# Patient Record
Sex: Female | Born: 1960 | Race: White | Hispanic: No | Marital: Single | Smoking: Never smoker
Health system: Southern US, Community
[De-identification: ages and names within clinical notes are randomized; demographics above are authoritative.]

## PROBLEM LIST (undated history)

## (undated) DIAGNOSIS — J45909 Unspecified asthma, uncomplicated: Secondary | ICD-10-CM

## (undated) DIAGNOSIS — R011 Cardiac murmur, unspecified: Secondary | ICD-10-CM

## (undated) DIAGNOSIS — C541 Malignant neoplasm of endometrium: Secondary | ICD-10-CM

## (undated) DIAGNOSIS — J189 Pneumonia, unspecified organism: Secondary | ICD-10-CM

## (undated) DIAGNOSIS — D219 Benign neoplasm of connective and other soft tissue, unspecified: Secondary | ICD-10-CM

## (undated) DIAGNOSIS — N83209 Unspecified ovarian cyst, unspecified side: Secondary | ICD-10-CM

## (undated) HISTORY — PX: DENTAL SURGERY: SHX609

## (undated) HISTORY — DX: Malignant neoplasm of endometrium: C54.1

## (undated) HISTORY — DX: Unspecified ovarian cyst, unspecified side: N83.209

## (undated) HISTORY — DX: Unspecified asthma, uncomplicated: J45.909

## (undated) HISTORY — DX: Pneumonia, unspecified organism: J18.9

## (undated) HISTORY — DX: Benign neoplasm of connective and other soft tissue, unspecified: D21.9

## (undated) HISTORY — DX: Cardiac murmur, unspecified: R01.1

---

## 2008-06-07 LAB — LIPID PANEL
Cholesterol: 218 mg/dL — AB (ref 0–200)
HDL: 56 mg/dL (ref 35–70)
LDL Cholesterol: 139 mg/dL

## 2010-06-08 ENCOUNTER — Other Ambulatory Visit: Payer: BC Managed Care – PPO

## 2010-06-12 ENCOUNTER — Ambulatory Visit: Payer: Self-pay | Admitting: Family Medicine

## 2012-03-23 ENCOUNTER — Other Ambulatory Visit: Payer: Self-pay | Admitting: Physician Assistant

## 2012-03-23 ENCOUNTER — Ambulatory Visit (INDEPENDENT_AMBULATORY_CARE_PROVIDER_SITE_OTHER): Payer: BC Managed Care – PPO | Admitting: Physician Assistant

## 2012-03-23 ENCOUNTER — Encounter: Payer: Self-pay | Admitting: Physician Assistant

## 2012-03-23 VITALS — BP 126/69 | HR 70 | Ht 65.5 in | Wt 289.0 lb

## 2012-03-23 DIAGNOSIS — D179 Benign lipomatous neoplasm, unspecified: Secondary | ICD-10-CM

## 2012-03-23 DIAGNOSIS — Z1231 Encounter for screening mammogram for malignant neoplasm of breast: Secondary | ICD-10-CM

## 2012-03-23 DIAGNOSIS — Z7689 Persons encountering health services in other specified circumstances: Secondary | ICD-10-CM

## 2012-03-23 DIAGNOSIS — Z1239 Encounter for other screening for malignant neoplasm of breast: Secondary | ICD-10-CM

## 2012-03-23 NOTE — Patient Instructions (Addendum)
Will refer for mammogram.   Schedule CPE/PAP.   Lipoma A lipoma is a noncancerous (benign) tumor composed of fat cells. They are usually found under the skin (subcutaneous). A lipoma may occur in any tissue of the body that contains fat. Common areas for lipomas to appear include the back, shoulders, buttocks, and thighs. Lipomas are a very common soft tissue growth. They are soft and grow slowly. Most problems caused by a lipoma depend on where it is growing. DIAGNOSIS  A lipoma can be diagnosed with a physical exam. These tumors rarely become cancerous, but radiographic studies can help determine this for certain. Studies used may include:  Computerized X-ray scans (CT or CAT scan).  Computerized magnetic scans (MRI). TREATMENT  Small lipomas that are not causing problems may be watched. If a lipoma continues to enlarge or causes problems, removal is often the best treatment. Lipomas can also be removed to improve appearance. Surgery is done to remove the fatty cells and the surrounding capsule. Most often, this is done with medicine that numbs the area (local anesthetic). The removed tissue is examined under a microscope to make sure it is not cancerous. Keep all follow-up appointments with your caregiver. SEEK MEDICAL CARE IF:   The lipoma becomes larger or hard.  The lipoma becomes painful, red, or increasingly swollen. These could be signs of infection or a more serious condition. Document Released: 01/25/2002 Document Revised: 04/29/2011 Document Reviewed: 07/07/2009 [facility] Patient Information 2013 [place], [place].

## 2012-03-23 NOTE — Progress Notes (Signed)
  Subjective:    Patient ID: Alicia White, female    DOB: 08/03/1960, 52 y.o.   MRN: [id number]  HPI Patient is a 52 yo female who presents to the clinic to establish care. PMH negative for any ongoing medical conditions and not on any daily medication. Pt has not been to a PCP in 3 years. Last pap, mammogram and blood done 3 years ago.   Social, surgical, family history reviewed and update. Denies smoking.  Patient would like to address lump on right thigh and lump on right side of head. She has had lump on right thigh for at least a year and lump on head for 3 plus years. Lump on right side of head feels like it is getting bigger. No pain or discomfort. NOt tried anything to make better. Nothing makes worse. Denies any headaches, vision changes, warmth or heat from lump. No drainage.    Review of Systems  Constitutional: Negative.   HENT: Negative.   Eyes: Negative.   Respiratory: Negative.   Gastrointestinal: Negative.   Genitourinary: Negative.   Musculoskeletal: Negative.   Skin: Negative.   Neurological: Negative.   Hematological: Negative.   Psychiatric/Behavioral: Negative.        Objective:   Physical Exam  Constitutional: She appears well-developed and well-nourished.       Obesity.  HENT:  Head: Atraumatic.       3 x 2 fluctuant mobile/not fixed nodule on the right middle skull. No pain with palpation.Soft.  1cm x 1 cm fluctuant nodule of right thigh. No pain. Mobile/not fixed. Soft.  Cardiovascular: Normal rate, regular rhythm and normal heart sounds.   Pulmonary/Chest: Effort normal and breath sounds normal.  Skin: Skin is warm and dry.  Psychiatric: She has a normal mood and affect. Her behavior is normal.          Assessment & Plan:  Lipoma- Reassured pt that lumps are lipomas. Gave handout. Will continue to watch for changes.   Need CPE. Reminded to come in fasting. Will refer for mammogram.

## 2012-03-25 ENCOUNTER — Ambulatory Visit: Payer: BC Managed Care – PPO | Admitting: Physician Assistant

## 2012-04-01 ENCOUNTER — Ambulatory Visit (INDEPENDENT_AMBULATORY_CARE_PROVIDER_SITE_OTHER): Payer: BC Managed Care – PPO

## 2012-04-01 ENCOUNTER — Other Ambulatory Visit (HOSPITAL_COMMUNITY)
Admission: RE | Admit: 2012-04-01 | Discharge: 2012-04-01 | Disposition: A | Discharge status: Home / Self Care | Payer: BC Managed Care – PPO | Source: Ambulatory Visit | Attending: Family Medicine | Admitting: Family Medicine

## 2012-04-01 ENCOUNTER — Encounter: Payer: Self-pay | Admitting: Physician Assistant

## 2012-04-01 ENCOUNTER — Ambulatory Visit (INDEPENDENT_AMBULATORY_CARE_PROVIDER_SITE_OTHER): Payer: BC Managed Care – PPO | Admitting: Physician Assistant

## 2012-04-01 VITALS — BP 129/75 | HR 70 | Wt 278.0 lb

## 2012-04-01 DIAGNOSIS — M7520 Bicipital tendinitis, unspecified shoulder: Secondary | ICD-10-CM

## 2012-04-01 DIAGNOSIS — L732 Hidradenitis suppurativa: Secondary | ICD-10-CM

## 2012-04-01 DIAGNOSIS — N939 Abnormal uterine and vaginal bleeding, unspecified: Secondary | ICD-10-CM

## 2012-04-01 DIAGNOSIS — N926 Irregular menstruation, unspecified: Secondary | ICD-10-CM

## 2012-04-01 DIAGNOSIS — Z01419 Encounter for gynecological examination (general) (routine) without abnormal findings: Secondary | ICD-10-CM | POA: Insufficient documentation

## 2012-04-01 DIAGNOSIS — Z131 Encounter for screening for diabetes mellitus: Secondary | ICD-10-CM

## 2012-04-01 DIAGNOSIS — Z1231 Encounter for screening mammogram for malignant neoplasm of breast: Secondary | ICD-10-CM

## 2012-04-01 DIAGNOSIS — Z124 Encounter for screening for malignant neoplasm of cervix: Secondary | ICD-10-CM

## 2012-04-01 DIAGNOSIS — L739 Follicular disorder, unspecified: Secondary | ICD-10-CM

## 2012-04-01 DIAGNOSIS — L679 Hair color and hair shaft abnormality, unspecified: Secondary | ICD-10-CM

## 2012-04-01 DIAGNOSIS — Z0000 Encounter for general adult medical examination without abnormal findings: Secondary | ICD-10-CM

## 2012-04-01 DIAGNOSIS — R5381 Other malaise: Secondary | ICD-10-CM

## 2012-04-01 DIAGNOSIS — Z13220 Encounter for screening for lipoid disorders: Secondary | ICD-10-CM

## 2012-04-01 DIAGNOSIS — M7522 Bicipital tendinitis, left shoulder: Secondary | ICD-10-CM

## 2012-04-01 DIAGNOSIS — R5383 Other fatigue: Secondary | ICD-10-CM

## 2012-04-01 LAB — COMPLETE METABOLIC PANEL WITH GFR
ALT: 26 U/L (ref 0–35)
Albumin: 4.3 g/dL (ref 3.5–5.2)
Alkaline Phosphatase: 50 U/L (ref 39–117)
GFR, Est Non African American: >89 mL/min
Glucose, Bld: 87 mg/dL (ref 70–99)
Potassium: 4.1 mEq/L (ref 3.5–5.3)
Sodium: 140 mEq/L (ref 135–145)
Total Bilirubin: 0.6 mg/dL (ref 0.3–1.2)
Total Protein: 7.6 g/dL (ref 6.0–8.3)

## 2012-04-01 LAB — CBC WITH DIFFERENTIAL/PLATELET
Basophils Relative: 0 % (ref 0–1)
HCT: 39.4 % (ref 36.0–46.0)
Hemoglobin: 13.1 g/dL (ref 12.0–15.0)
Lymphocytes Relative: 22 % (ref 12–46)
Lymphs Abs: 2.2 10*3/uL (ref 0.7–4.0)
MCHC: 33.2 g/dL (ref 30.0–36.0)
Monocytes Absolute: 0.7 10*3/uL (ref 0.1–1.0)
Monocytes Relative: 7 % (ref 3–12)
Neutro Abs: 6.8 10*3/uL (ref 1.7–7.7)
Neutrophils Relative %: 69 % (ref 43–77)
RBC: 4.61 MIL/uL (ref 3.87–5.11)
WBC: 9.9 10*3/uL (ref 4.0–10.5)

## 2012-04-01 LAB — LIPID PANEL
LDL Cholesterol: 137 mg/dL — ABNORMAL HIGH (ref 0–99)
Total CHOL/HDL Ratio: 4.3 Ratio

## 2012-04-01 LAB — VITAMIN B12: Vitamin B-12: 337 pg/mL (ref 211–911)

## 2012-04-01 MED ORDER — DOXYCYCLINE HYCLATE 100 MG PO CAPS: 100.0000 mg | ORAL_CAPSULE | Freq: Two times a day (BID) | ORAL | Status: DC

## 2012-04-01 MED ORDER — SPIRONOLACTONE 50 MG PO TABS: 50.0000 mg | ORAL_TABLET | Freq: Two times a day (BID) | ORAL | Status: DC

## 2012-04-01 MED ORDER — MUPIROCIN 2 % EX OINT: TOPICAL_OINTMENT | Freq: Three times a day (TID) | CUTANEOUS | Status: DC

## 2012-04-01 NOTE — Progress Notes (Signed)
Subjective:    Patient ID: Alicia White, female    DOB: Apr 12, 1960, 52 y.o.   MRN: [id number]  HPI 1   Review of Systems     Objective:   Physical Exam        Assessment & Plan:   Subjective:     Alicia White is a 52 y.o. female and is here for a comprehensive physical exam. The patient reports problems - left shoulder pain, vaginal spotting, bilateral wrist pain, coarse hair growth.  and boils all over body.  Left shoulder has been painful for a couple of months. Denies any trauma to shoulder. Aleve helps. Movement makes worse. Sometimes better than other times. At worse 7/10. Able to sleep on it without pain. Does not wake her up at night.  Vaginal spotting started early January and has continued since. She had not had a period in 6 months up to then. Denies any pain assoicated. Not on OCP. Wonders about menopause. She has always had irregular periods unless she was on OCP> she is very fatigued and feels more worn out. Denies SOB.   She is having bilateral wrist pain for over 2 years. Her father had carpel tunnel. She types all day long. Night splints help at night. Aleve also helps. Numbness and tingling goes into both hands.   She would like something to help with coarse hair growth. She has had this problem for many years since her early 40's. She does not notice getting better or worse.    She is having a lot of boils pop up under her arm and in between her legs. She has not done anything to make better. She does not notice that anything makes worse. They do ache and sometimes drain. Denies any fever, chills, muscle ache.   History   Social History  . Marital Status: Single    Spouse Name: N/A    Number of Children: N/A  . Years of Education: N/A   Occupational History  . Not on file.   Social History Main Topics  . Smoking status: Never Smoker   . Smokeless tobacco: Never Used  . Alcohol Use: No  . Drug Use: No  . Sexually Active: Yes   Other Topics Concern   . Not on file   Social History Narrative  . No narrative on file   Health Maintenance  Topic Date Due  . Influenza Vaccine  03/23/2013  . Pap Smear  02/18/2014  . Tetanus/tdap  03/24/2019    The following portions of the patient's history were reviewed and updated as appropriate: allergies, current medications, past family history, past medical history, past social history, past surgical history and problem list.  Review of Systems A comprehensive review of systems was negative.   Objective:    BP 129/75  Pulse 70  Wt 278 lb (126.1 kg)  BMI 45.54 kg/m2  LMP 03/21/2012 General appearance: alert, cooperative, appears stated age and moderately obese Head: Normocephalic, without obvious abnormality, atraumatic Eyes: conjunctivae/corneas clear. PERRL, EOM's intact. Fundi benign. Ears: normal TM's and external ear canals both ears Nose: Nares normal. Septum midline. Mucosa normal. No drainage or sinus tenderness. Throat: lips, mucosa, and tongue normal; teeth and gums normal Neck: no adenopathy, no carotid bruit, no JVD, supple, symmetrical, trachea midline and thyroid not enlarged, symmetric, no tenderness/mass/nodules Back: symmetric, no curvature. ROM normal. No CVA tenderness. Lungs: clear to auscultation bilaterally Breasts: normal appearance, no masses or tenderness Heart: regular rate and rhythm, S1, S2 normal, no murmur,  click, rub or gallop Abdomen: soft, non-tender; bowel sounds normal; no masses,  no organomegaly Pelvic: cervix normal in appearance, external genitalia normal, no adnexal masses or tenderness, no cervical motion tenderness, uterus normal size, shape, and consistency and blood coming from cervical os Extremities: extremities normal, atraumatic, no cyanosis or edema left shoulder: ROM normal but with significant pain. Tenderness to palpation over bicep tendon proximal. No tenderness over acromion or clavice. More pain with supination than pronation. Strength  decreased 4/5 with abduction 5/5 with adduction. No bruises, swelling or color changes.  Pulses: 2+ and symmetric Skin: Pustules under bilateral arms, in between breast, under left breast, and in between bilateral legs.  Lymph nodes: Cervical, supraclavicular, and axillary nodes normal. Neurologic: Grossly normal DTR's decreased no ankle reflex in either knee.    Assessment:    Healthy female exam.      Plan:    CPE/Pap- Screening labs obtained. Vaccines up to date. Will call with pap results. Reminded pt of diet and exercise. Discussed calcium 500mg  twice a day or 4 servings daily.   Bicep tendonitis- Gave exercises. Take aleve as needed for pain. Ice regularly. If not improving could consider injections and potenitally imaging.   Bilateral wrist pain- Reassured pt that she is doing the right thing. Consider night splints and regular aleve usage. If continuing to bother you can order imaging studies and see if surgery is an option.  Abnormal uterine bleeding/hair growth- Could be caused by going into menopause. Will check FSH and tSH. Also could be caused by PCOS. i really suspect she has had this her entire life and never diagnosed. She has course hair growth, infertility, abnormal periods, obesity. Gave handout to look at. Will check testosterone. Let's go ahead and start spironactlone 50mg  twice a day. Follow up in 3 months.   hidradenitis suppurative- Gave handout. Sent Doxycycline for 10 days. Gave bactroban to apply to boils as needed.  See After Visit Summary for Counseling Recommendations

## 2012-04-01 NOTE — Patient Instructions (Addendum)
Polycystic Ovarian Syndrome Polycystic ovarian syndrome is a condition with a number of problems. One problem is with the ovaries. The ovaries are organs located in the female pelvis, on each side of the uterus. Usually, during the menstrual cycle, an egg is released from 1 ovary every month. This is called ovulation. When the egg is fertilized, it goes into the womb (uterus), which allows for the growth of a baby. The egg travels from the ovary through the fallopian tube to the uterus. The ovaries also make the hormones estrogen and progesterone. These hormones help the development of a woman's breasts, body shape, and body hair. They also regulate the menstrual cycle and pregnancy. Sometimes, cysts form in the ovaries. A cyst is a fluid-filled sac. On the ovary, different types of cysts can form. The most common type of ovarian cyst is called a functional or ovulation cyst. It is normal, and often forms during the normal menstrual cycle. Each month, a woman's ovaries grow tiny cysts that hold the eggs. When an egg is fully grown, the sac breaks open. This releases the egg. Then, the sac which released the egg from the ovary dissolves. In one type of functional cyst, called a follicle cyst, the sac does not break open to release the egg. It may actually continue to grow. This type of cyst usually disappears within 1 to 3 months.  One type of cyst problem with the ovaries is called Polycystic Ovarian Syndrome (PCOS). In this condition, many follicle cysts form, but do not rupture and produce an egg. This health problem can affect the following:  Menstrual cycle.  Heart.  Obesity.  Cancer of the uterus.  Fertility.  Blood vessels.  Hair growth (face and body) or baldness.  Hormones.  Appearance.  High blood pressure.  Stroke.  Insulin production.  Inflammation of the liver.  Elevated blood cholesterol and triglycerides. CAUSES   No one knows the exact cause of PCOS.  Women with  PCOS often have a mother or sister with PCOS. There is not yet enough proof to say this is inherited.  Many women with PCOS have a weight problem.  Researchers are looking at the relationship between PCOS and the body's ability to make insulin. Insulin is a hormone that regulates the change of sugar, starches, and other food into energy for the body's use, or for storage. Some women with PCOS make too much insulin. It is possible that the ovaries react by making too many female hormones, called androgens. This can lead to acne, excessive hair growth, weight gain, and ovulation problems.  Too much production of luteinizing hormone (LH) from the pituitary gland in the brain stimulates the ovary to produce too much female hormone (androgen). SYMPTOMS   Infrequent or no menstrual periods, and/or irregular bleeding.  Inability to get pregnant (infertility), because of not ovulating.  Increased growth of hair on the face, chest, stomach, back, thumbs, thighs, or toes.  Acne, oily skin, or dandruff.  Pelvic pain.  Weight gain or obesity, usually carrying extra weight around the waist.  Type 2 diabetes (this is the diabetes that usually does not need insulin).  High cholesterol.  High blood pressure.  Female-pattern baldness or thinning hair.  Patches of thickened and dark brown or black skin on the neck, arms, breasts, or thighs.  Skin tags, or tiny excess flaps of skin, in the armpits or neck area.  Sleep apnea (excessive snoring and breathing stops at times while asleep).  Deepening of the voice.  Gestational diabetes when pregnant.  Increased risk of miscarriage with pregnancy. DIAGNOSIS  There is no single test to diagnose PCOS.   Your caregiver will:  Take a medical history.  Perform a pelvic exam.  Perform an ultrasound.  Check your female and female hormone levels.  Measure glucose or sugar levels in the blood.  Do other blood tests.  If you are producing too many  female hormones, your caregiver will make sure it is from PCOS. At the physical exam, your caregiver will want to evaluate the areas of increased hair growth. Try to allow natural hair growth for a few days before the visit.  During a pelvic exam, the ovaries may be enlarged or swollen by the increased number of small cysts. This can be seen more easily by vaginal ultrasound or screening, to examine the ovaries and lining of the uterus (endometrium) for cysts. The uterine lining may become thicker, if there has not been a regular period. TREATMENT  Because there is no cure for PCOS, it needs to be managed to prevent problems. Treatments are based on your symptoms. Treatment is also based on whether you want to have a baby or whether you need contraception.  Treatment may include:  Progesterone hormone, to start a menstrual period.  Birth control pills, to make you have regular menstrual periods.  Medicines to make you ovulate, if you want to get pregnant.  Medicines to control your insulin.  Medicine to control your blood pressure.  Medicine and diet, to control your high cholesterol and triglycerides in your blood.  Surgery, making small holes in the ovary, to decrease the amount of female hormone production. This is done through a long, lighted tube (laparoscope), placed into the pelvis through a tiny incision in the lower abdomen. Your caregiver will go over some of the choices with you. WOMEN WITH PCOS HAVE THESE CHARACTERISTICS:  High levels of female hormones called androgens.  An irregular or no menstrual cycle.  May have many small cysts in their ovaries. PCOS is the most common hormonal reproductive problem in women of childbearing age. WHY DO WOMEN WITH PCOS HAVE TROUBLE WITH THEIR MENSTRUAL CYCLE? Each month, about 20 eggs start to mature in the ovaries. As one egg grows and matures, the follicle breaks open to release the egg, so it can travel through the fallopian tube for  fertilization. When the single egg leaves the follicle, ovulation takes place. In women with PCOS, the ovary does not make all of the hormones it needs for any of the eggs to fully mature. They may start to grow and accumulate fluid, but no one egg becomes large enough. Instead, some may remain as cysts. Since no egg matures or is released, ovulation does not occur and the hormone progesterone is not made. Without progesterone, a woman's menstrual cycle is irregular or absent. Also, the cysts produce female hormones, which continue to prevent ovulation.  Document Released: 05/31/2004 Document Revised: 04/29/2011 Document Reviewed: 12/23/2008 [facility] Patient Information 2013 [place], [place].   Hidradenitis Suppurativa, Sweat Gland Abscess Hidradenitis suppurativa is a long lasting (chronic), uncommon disease of the sweat glands. With this, boil-like lumps and scarring develop in the groin, some times under the arms (axillae), and under the breasts. It may also uncommonly occur behind the ears, in the crease of the buttocks, and around the genitals.  CAUSES  The cause is from a blocking of the sweat glands. They then become infected. It may cause drainage and odor. It is not contagious.  So it cannot be given to someone else. It most often shows up in puberty (about 38 to 50 years of age). But it may happen much later. It is similar to acne which is a disease of the sweat glands. This condition is slightly more common in African-Americans and women. SYMPTOMS   Hidradenitis usually starts as one or more red, tender, swellings in the groin or under the arms (axilla).  Over a period of hours to days the lesions get larger. They often open to the skin surface, draining clear to yellow-colored fluid.  The infected area heals with scarring. DIAGNOSIS  Your caregiver makes this diagnosis by looking at you. Sometimes cultures (growing germs on plates in the lab) may be taken. This is to see what germ  (bacterium) is causing the infection.  TREATMENT   Topical germ killing medicine applied to the skin (antibiotics) are the treatment of choice. Antibiotics taken by mouth (systemic) are sometimes needed when the condition is getting worse or is severe.  Avoid tight-fitting clothing which traps moisture in.  Dirt does not cause hidradenitis and it is not caused by poor hygiene.  Involved areas should be cleaned daily using an antibacterial soap. Some patients find that the liquid form of Lever 2000, applied to the involved areas as a lotion after bathing, can help reduce the odor related to this condition.  Sometimes surgery is needed to drain infected areas or remove scarred tissue. Removal of large amounts of tissue is used only in severe cases.  Birth control pills may be helpful.  Oral retinoids (vitamin A derivatives) for 6 to 12 months which are effective for acne may also help this condition.  Weight loss will improve but not cure hidradenitis. It is made worse by being overweight. But the condition is not caused by being overweight.  This condition is more common in people who have had acne.  It may become worse under stress. There is no medical cure for hidradenitis. It can be controlled, but not cured. The condition usually continues for years with periods of getting worse and getting better (remission). Document Released: 09/19/2003 Document Revised: 04/29/2011 Document Reviewed: 10/05/2007 [facility] Patient Information 2013 [place], [place].    Carpal Tunnel Syndrome The carpal tunnel is a narrow area located on the palm side of your wrist. The tunnel is formed by the wrist bones and ligaments. Nerves, blood vessels, and tendons pass through the carpal tunnel. Repeated wrist motion or certain diseases may cause swelling within the tunnel. This swelling pinches the main nerve in the wrist (median nerve) and causes the painful hand and arm condition called carpal tunnel  syndrome. CAUSES   Repeated wrist motions.  Wrist injuries.  Certain diseases like arthritis, diabetes, alcoholism, hyperthyroidism, and kidney failure.  Obesity.  Pregnancy. SYMPTOMS   A "pins and needles" feeling in your fingers or hand.  Tingling or numbness in your fingers or hand.  An aching feeling in your entire arm.  Wrist pain that goes up your arm to your shoulder.  Pain that goes down into your palm or fingers.  A weak feeling in your hands. DIAGNOSIS  Your caregiver will take your history and perform a physical exam. An electromyography test may be needed. This test measures electrical signals sent out by the muscles. The electrical signals are usually slowed by carpal tunnel syndrome. You may also need X-rays. TREATMENT  Carpal tunnel syndrome may clear up by itself. Your caregiver may recommend a wrist splint or medicine such as  a nonsteroidal anti-inflammatory medicine. Cortisone injections may help. Sometimes, surgery may be needed to free the pinched nerve.  HOME CARE INSTRUCTIONS   Take all medicine as directed by your caregiver. Only take over-the-counter or prescription medicines for pain, discomfort, or fever as directed by your caregiver.  If you were given a splint to keep your wrist from bending, wear it as directed. It is important to wear the splint at night. Wear the splint for as long as you have pain or numbness in your hand, arm, or wrist. This may take 1 to 2 months.  Rest your wrist from any activity that may be causing your pain. If your symptoms are work-related, you may need to talk to your employer about changing to a job that does not require using your wrist.  Put ice on your wrist after long periods of wrist activity.  Put ice in a plastic bag.  Place a towel between your skin and the bag.  Leave the ice on for 15 to 20 minutes, 3 to 4 times a day.  Keep all follow-up visits as directed by your caregiver. This includes any orthopedic  referrals, physical therapy, and rehabilitation. Any delay in getting necessary care could result in a delay or failure of your condition to heal. SEEK IMMEDIATE MEDICAL CARE IF:   You have new, unexplained symptoms.  Your symptoms get worse and are not helped or controlled with medicines. MAKE SURE YOU:   Understand these instructions.  Will watch your condition.  Will get help right away if you are not doing well or get worse. Document Released: 02/02/2000 Document Revised: 04/29/2011 Document Reviewed: 12/21/2010 [facility] Patient Information 2013 [place], [place]. Carpal Tunnel Surgery The carpal tunnel is a narrow hollow area in the wrist. It is formed by the wrist bones and ligaments. Nerves, blood vessels, and tendons on the palm side of your hand pass through the carpal tunnel. (The palm side is the side of your hand in the direction your fingers bend.) Repeated wrist motion or certain diseases may cause swelling within the tunnel. That is why these are sometimes called repetitive trauma disorders. It is also a common problem in late pregnancy because of water retention. This swelling pinches the main nerve in the wrist (median nerve). It causes the painful condition called carpal tunnel syndrome. A feeling of "pins and needles" may be noticed in the fingers or hand. The entire arm may ache from this condition. Carpal tunnel syndrome may clear up by itself. Cortisone injections may help. An electromyogram may be needed to confirm this diagnosis. This is a test which measures nerve conduction. The nerve conduction is usually slowed in a carpal tunnel syndrome. Sometimes, an operation may be needed to free the pinched nerve.  LET YOUR CAREGIVER KNOW ABOUT:  Allergies  Medications taken including herbs, eye drops, over the counter medications, and creams  Use of steroids (by mouth or creams)  Previous problems with anesthetics or novocaine  Possibility of pregnancy, if this  applies  History of blood clots (thrombophlebitis)  History of bleeding or blood problems  Previous surgery  Other health problems RISKS AND COMPLICATIONS  Infection: A germ starts growing in the wound. This can usually be treated with antibiotics.  Damage to other organs may occur.  Bleeding following surgery can be a complication of almost all surgeries. Your surgeon takes every precaution to keep this from happening.  Recurrence (return) of carpal tunnel syndrome following treatment is rare. BEFORE THE PROCEDURE  Stop smoking  at least two weeks prior to surgery. This lowers risk during surgery.  Stop non steroidal medicine for ten days prior to surgery. Also, do not take aspirin unless OK'd by your surgeon.  Your caregiver may tell you to stop taking certain medicine that may affect the outcome of the surgery and your ability to heal. For example, you may need to stop taking anti-inflammatories, such as aspirin, because of possible bleeding problems. Other medicine may have interactions with anesthesia.  BE SURE TO LET YOUR CAREGIVER KNOW IF YOU HAVE BEEN ON STEROIDS (INCLUDING CREAMS) FOR LONG PERIODS OF TIME. THIS IS CRITICAL.  Your caregiver will discuss possible risks and complications with you before surgery. In addition to the usual risks of anesthesia, other common risks and complications include:  Temporary increase in pain due to surgery.  Uncorrected pain.  Infection. You should be present 60 minutes before your procedure or as directed.  PROCEDURE  Carpal tunnel release is generally recommended if symptoms last for 6 months. Surgery involves severing the band of tissue around the wrist to reduce pressure on the median nerve. Surgery is done under local anesthesia and does not require an overnight hospital stay. Many patients require surgery on both hands. The following are types of carpal tunnel release surgery:  Open release surgery, the traditional procedure  used to correct carpal tunnel syndrome, consists of making an incision up to 2 inches in the wrist and then cutting the carpal ligament to enlarge the carpal tunnel. The procedure is generally done under local anesthesia on an outpatient basis, unless there are unusual medical considerations.  Endoscopic surgery may allow faster functional recovery and less post-operative discomfort than traditional open release surgery. The surgeon makes two incisions (cuts) (about 1/2 inch each) in the wrist and palm, inserts a camera attached to a tube, looks at the tissue on a screen, and cuts the carpal ligament (the tissue that holds joints together). This two-portal endoscopic surgery, generally performed under local anesthesia, is effective and minimizes scarring and scar tenderness, if any. One-portal endoscopic surgery for carpal tunnel syndrome is also available. Although symptoms may be better right after surgery, full recovery from carpal tunnel surgery can take months. Some patients may have infection, nerve damage, stiffness, and pain at the scar. Sometimes the wrist loses strength because the carpal ligament is cut. Patients should take part in physical therapy after surgery to restore wrist strength. Some patients may need to adjust job duties or even change jobs after recovery from surgery. The majority of patients recover completely without complications (additional problems). AFTER THE PROCEDURE After surgery, you will be taken to the recovery area where a nurse will watch and check your progress. Once you're awake, stable, and taking fluids well, without other problems you will be allowed to go home. HOME CARE INSTRUCTIONS   Once at home, an ice pack applied to your operative site may help with discomfort and keep the swelling down.  Follow your caregiver's instructions as to activities, exercises, physical therapy, and driving a car.  Maintain strength and range of motion as instructed.  If you  were given a splint to keep your wrist from bending, use it as instructed. It is important to wear the splint at night. Use the splint for as long as you have pain or numbness in your hand, arm or wrist. This may take 1 to 2 months.  If you have pain at night, it may help to elevate your hand above the level of your  heart (the center of your chest).  It is important to avoid activities which originally caused your carpal tunnel syndrome for a couple weeks following surgery, or as directed by your surgeon. If your symptoms are work-related, you may need to talk to your employer about changing to a job that does not require using your wrist.  Only take over-the-counter or prescription medicines for pain, discomfort, or fever as directed by your caregiver.  Following periods of extended use, particularly hard (strenuous) use, apply an ice pack wrapped in a towel to the palm (anterior) side of the affected wrist for 20 to 30 minutes. Repeat as needed three to four times per day. This will help reduce swelling following surgery. SEEK MEDICAL CARE IF:   There is increased bleeding (more than a small spot) from the wound.  You notice redness, swelling, or increasing pain in the wound.  Pus is coming from wound.  An unexplained oral temperature above 102 F (38.9 C) develops.  You notice a foul smell coming from the wound or dressing. SEEK IMMEDIATE MEDICAL CARE IF:   You develop a rash.  You have difficulty breathing.  You have any problems you think are related to allergies. Document Released: 09/19/2003 Document Revised: 04/29/2011 Document Reviewed: 12/11/2006 [facility] Patient Information 2013 [place], [place]. Biceps Tendon Tendinitis (Proximal) and Tenosynovitis with Rehab Tendonitis and tenosynovitis involve inflammation of the tendon and the tendon lining (sheath). The proximal biceps tendon is vulnerable to tendonitis and tenosynovitis, which causes pain and discomfort in the front of  the shoulder and upper arm. The tendon lining secretes a fluid that helps lubricate the tendon, allowing for proper function without pain. When the tendon and its lining become inflamed, the tendon can no longer glide smoothly, causing pain. The proximal biceps tendon connects the biceps muscle to two bones of the shoulder. It is important for proper function of the elbow and turning the palm upward (supination) using the wrist. Proximal biceps tendon tendinitis may include a grade 1 or 2 strain of the tendon. Grade 1 strains involve a slight pull of the tendon without signs of tearing and no observed tendon lengthening. There is also no loss of strength. Grade 2 strains involve small tears in the tendon fibers. The tendon or muscle is stretched and strength is usually decreased.  SYMPTOMS   Pain, tenderness, swelling, warmth, or redness over the front of the shoulder.  Pain that gets worse with shoulder and elbow use, especially against resistance.  Limited motion of the shoulder or elbow.  Crackling sound (crepitation) when the tendon or shoulder is moved or touched. CAUSES  The symptoms of biceps tendonitis are due to inflammation of the tendon. Inflammation may be caused by:  Strain from sudden increase in amount or intensity of activity.  Direct blow or injury to the elbow (uncommon).  Overuse or repetitive elbow bending or wrist rotation, particularly when turning the palm up, or with elbow hyperextension. RISK INCREASES WITH:  Sports that involve contact or overhead arm activity (throwing sports, gymnastics, weightlifting, bodybuilding, rock climbing).  Heavy labor.  Poor strength and flexibility.  Failure to warm up properly before activity. PREVENTION  Warm up and stretch properly before activity.  Allow time for recovery between activities.  Maintain physical fitness:  Strength, flexibility, and endurance.  Cardiovascular fitness.  Learn and use proper exercise  technique. PROGNOSIS  With proper treatment, proximal biceps tendon tendonitis and tenosynovitis is usually curable within 6 weeks. Healing is usually quicker if the cause was  a direct blow, not overuse.  RELATED COMPLICATIONS   Longer healing time if not properly treated or if not given enough time to heal.  Chronically inflamed tendon that causes persistent pain with activity, that may progress to constant pain and potentially rupture of the tendon.  Recurring symptoms, especially if activity is resumed too soon or with overuse, a direct blow, or use of poor exercise technique. TREATMENT Treatment first involves ice and medicine, to reduce pain and inflammation. It is helpful to modify activities that cause pain, to reduce the chances of causing the condition to get worse. Strengthening and stretching exercises should be performed to promote proper use of the muscles of the shoulder. These exercises may be performed at home or with a therapist. Other treatments may be given such as ultrasound or heat therapy. A corticosteroid injection may be recommended to help reduce inflammation of the tendon lining. Surgery is usually not necessary. Sometimes, if symptoms last for greater than 6 months, surgery will be advised to detach the tendon and re-insert it into the arm bone. Surgery to correct other shoulder problems that may be contributing to tendinitis may be advised before surgery for the tendinitis itself.  MEDICATION  If pain medicine is needed, nonsteroidal anti-inflammatory medicines (aspirin and ibuprofen), or other minor pain relievers (acetaminophen), are often advised.  Do not take pain medicine for 7 days before surgery.  Prescription pain relievers may be given if your caregiver thinks they are needed. Use only as directed and only as much as you need.  Corticosteroid injections may be given. These injections should only be used on the most severe cases, as one can only receive a  limited number of them. HEAT AND COLD   Cold treatment (icing) should be applied for 10 to 15 minutes every 2 to 3 hours for inflammation and pain, and immediately after activity that aggravates your symptoms. Use ice packs or an ice massage.  Heat treatment may be used before performing stretching and strengthening activities prescribed by your caregiver, physical therapist, or athletic trainer. Use a heat pack or a warm water soak. SEEK MEDICAL CARE IF:   Symptoms get worse or do not improve in 2 weeks, despite treatment.  New, unexplained symptoms develop. (Drugs used in treatment may produce side effects.) EXERCISES RANGE OF MOTION (ROM) AND EXERCISES - Biceps Tendon (Proximal) and Tenosynovitis These exercises may help you when beginning to rehabilitate your injury. Your symptoms may go away with or without further involvement from your physician, physical therapist, or athletic trainer. While completing these exercises, remember:   Restoring tissue flexibility helps normal motion to return to the joints. This allows healthier, less painful movement and activity.  An effective stretch should be held for at least 30 seconds.  A stretch should never be painful. You should only feel a gentle lengthening or release in the stretched tissue. STRETCH  Flexion, Standing  Stand with good posture. With an underhand grip on your right / left hand and an overhand grip on the opposite hand, grasp a broomstick or cane so that your hands are a little more than shoulder width apart.  Keeping your right / left elbow straight and shoulder muscles relaxed, push the stick with your opposite hand to raise your right / left arm in front of your body and then overhead. Raise your arm until you feel a stretch in your right / left shoulder, but before you have increased shoulder pain.  Try to avoid shrugging your right / left  shoulder as your arm rises, by keeping your shoulder blade tucked down and toward  your mid-back spine. Hold for __________ seconds.  Slowly return to the starting position. Repeat __________ times. Complete this exercise __________ times per day. STRETCH  Abduction, Supine  Lie on your back. With an underhand grip on your right / left hand and an overhand grip on the opposite hand, grasp a broomstick or cane so that your hands are a little more than shoulder width apart.  Keeping your right / left elbow straight and shoulder muscles relaxed, push the stick with your opposite hand to raise your right / left arm out to the side of your body and then overhead. Raise your arm until you feel a stretch in your right / left shoulder, but before you have increased shoulder pain.  Try to avoid shrugging your right / left shoulder as your arm rises, by keeping your shoulder blade tucked down and toward your mid-back spine. Hold for __________ seconds.  Slowly return to the starting position. Repeat __________ times. Complete this exercise __________ times per day. ROM  Flexion, Active-Assisted  Lie on your back. You may bend your knees for comfort.  Grasp a broomstick or cane so your hands are about shoulder width apart. Your right / left hand should grip the end of the stick so that your hand is positioned "thumbs-up," as if you were about to shake hands.  Using your healthy arm to lead, raise your right / left arm overhead until you feel a gentle stretch in your shoulder. Hold for __________ seconds.  Use the stick to assist in returning your right / left arm to its starting position. Repeat __________ times. Complete this exercise __________ times per day.  STRETCH  Flexion, Standing   Stand facing a wall. Walk your right / left fingers up the wall until you feel a moderate stretch in your shoulder. As your hand gets higher, you may need to step closer to the wall or use a door frame to walk through.  Try to avoid shrugging your right / left shoulder as your arm rises, by  keeping your shoulder blade tucked down and toward your mid-back spine.  Hold for __________ seconds. Use your other hand, if needed, to ease out of the stretch and return to the starting position. Repeat __________ times. Complete this exercise __________ times per day.  ROM - Internal Rotation   Using underhand grips, grasp a stick behind your back with both hands.  While standing upright with good posture, slide the stick up your back until you feel a mild stretch in the front of your shoulder.  Hold for __________ seconds. Slowly return to your starting position. Repeat __________ times. Complete this exercise __________ times per day.  STRETCH - Internal Rotation  Place your right / left hand behind your back, palm-up.  Throw a towel or belt over your opposite shoulder. Grasp the towel with your right / left hand.  While keeping an upright posture, gently pull up on the towel until you feel a stretch in the front of your right / left shoulder.  Avoid shrugging your right / left shoulder as your arm rises, by keeping your shoulder blade tucked down and toward your mid-back spine.  Hold for __________ seconds. Release the stretch by lowering your opposite hand. Repeat __________ times. Complete this exercise __________ times per day. STRENGTHENING EXERCISES - Biceps Tendon Tendinitis (Proximal) and Tenosynovitis These exercises may help you regain your strength after  your physician has discontinued your restraint in a cast or brace. They may resolve your symptoms with or without further involvement from your physician, physical therapist or athletic trainer. While completing these exercises, remember:   Muscles can gain both the endurance and the strength needed for everyday activities through controlled exercises.  Complete these exercises as instructed by your physician, physical therapist or athletic trainer. Increase the resistance and repetitions only as guided.  You may  experience muscle soreness or fatigue, but the pain or discomfort you are trying to eliminate should never worsen during these exercises. If this pain does get worse, stop and make sure you are following the directions exactly. If the pain is still present after adjustments, discontinue the exercise until you can discuss the trouble with your caregiver. STRENGTH - Elbow Flexors, Isometric  Stand or sit upright on a firm surface. Place your right / left arm so that your hand is palm-up and at the height of your waist.  Place your opposite hand on top of your forearm. Gently push down as your right / left arm resists. Push as hard as you can with both arms, without causing any pain or movement at your right / left elbow. Hold this stationary position for __________ seconds.  Gradually release the tension in both arms. Allow your muscles to relax completely before repeating. Repeat __________ times. Complete this exercise __________ times per day. STRENGTH - Shoulder Flexion, Isometric  With good posture and facing a wall, stand or sit about 4-6 inches away.  Keeping your right / left elbow straight, gently press the top of your fist into the wall. Increase the pressure gradually until you are pressing as hard as you can, without shrugging your shoulder or increasing any shoulder discomfort.  Hold for __________ seconds.  Release the tension slowly. Relax your shoulder muscles completely before you start the next repetition. Repeat __________ times. Complete this exercise __________ times per day.  STRENGTH  Elbow Flexors, Supinated  With good posture, stand or sit on a firm chair without armrests. Allow your right / left arm to rest at your side with your palm facing forward.  Holding a __________ weight, or gripping a rubber exercise band or tubing,  bring your hand toward your shoulder.  Allow your muscles to control the resistance as your hand returns to your side. Repeat __________  times. Complete this exercise __________ times per day.  STRENGTH - Shoulder Flexion  Stand or sit with good posture. Grasp a __________ weight, or an exercise band or tubing, so that your hand is "thumbs-up," like when you shake hands.  Slowly lift your right / left arm as far as you can, without increasing any shoulder pain. At first, many people can only raise their hand to shoulder height.  Avoid shrugging your right / left shoulder as your arm rises, by keeping your shoulder blade tucked down and toward your mid-back spine.  Hold for __________ seconds. Control the descent of your hand as you slowly return to your starting position. Repeat __________ times. Complete this exercise __________ times per day. Document Released: 02/04/2005 Document Revised: 04/29/2011 Document Reviewed: 05/19/2008 [facility] Patient Information 2013 [place], [place].

## 2012-04-02 LAB — VITAMIN D 25 HYDROXY (VIT D DEFICIENCY, FRACTURES): Vit D, 25-Hydroxy: 32 ng/mL (ref 30–89)

## 2012-04-03 ENCOUNTER — Encounter: Payer: Self-pay | Admitting: Physician Assistant

## 2012-04-03 DIAGNOSIS — J45909 Unspecified asthma, uncomplicated: Secondary | ICD-10-CM | POA: Insufficient documentation

## 2012-04-03 DIAGNOSIS — J45901 Unspecified asthma with (acute) exacerbation: Secondary | ICD-10-CM | POA: Insufficient documentation

## 2012-04-03 LAB — TESTOSTERONE, FREE, TOTAL, SHBG
Sex Hormone Binding: 33 nmol/L (ref 18–114)
Testosterone, Free: 7.9 pg/mL — ABNORMAL HIGH (ref 0.6–6.8)
Testosterone-% Free: 1.8 % (ref 0.4–2.4)
Testosterone: 44 ng/dL (ref 10–70)

## 2012-04-09 ENCOUNTER — Encounter: Payer: Self-pay | Admitting: *Deleted

## 2012-04-29 ENCOUNTER — Telehealth: Payer: Self-pay | Admitting: *Deleted

## 2012-04-29 DIAGNOSIS — N920 Excessive and frequent menstruation with regular cycle: Secondary | ICD-10-CM

## 2012-04-29 DIAGNOSIS — N938 Other specified abnormal uterine and vaginal bleeding: Secondary | ICD-10-CM

## 2012-04-29 NOTE — Telephone Encounter (Signed)
Pt calls & states that she has started bleeding again & it's "pretty heavy" & she's passing clots.  She started last Thursday night. She has been on the spiractalone. Please advise.

## 2012-04-30 ENCOUNTER — Other Ambulatory Visit: Payer: Self-pay | Admitting: Physician Assistant

## 2012-04-30 DIAGNOSIS — N920 Excessive and frequent menstruation with regular cycle: Secondary | ICD-10-CM

## 2012-04-30 DIAGNOSIS — N938 Other specified abnormal uterine and vaginal bleeding: Secondary | ICD-10-CM

## 2012-04-30 MED ORDER — MEDROXYPROGESTERONE ACETATE 10 MG PO TABS: ORAL_TABLET | ORAL | Status: DC

## 2012-04-30 NOTE — Telephone Encounter (Signed)
Will schedule a pelvic ultrasound and also send to pharmacy some progesterone to take for next 10 days to see if we can stop bleeding. Call if bleeding not stopped.

## 2012-04-30 NOTE — Telephone Encounter (Signed)
Pt.notified

## 2012-05-05 ENCOUNTER — Ambulatory Visit (INDEPENDENT_AMBULATORY_CARE_PROVIDER_SITE_OTHER): Payer: BC Managed Care – PPO

## 2012-05-05 DIAGNOSIS — N920 Excessive and frequent menstruation with regular cycle: Secondary | ICD-10-CM

## 2012-05-05 DIAGNOSIS — N83209 Unspecified ovarian cyst, unspecified side: Secondary | ICD-10-CM

## 2012-05-05 DIAGNOSIS — N938 Other specified abnormal uterine and vaginal bleeding: Secondary | ICD-10-CM

## 2012-05-05 DIAGNOSIS — N859 Noninflammatory disorder of uterus, unspecified: Secondary | ICD-10-CM

## 2012-05-05 DIAGNOSIS — N949 Unspecified condition associated with female genital organs and menstrual cycle: Secondary | ICD-10-CM

## 2012-05-05 DIAGNOSIS — D251 Intramural leiomyoma of uterus: Secondary | ICD-10-CM

## 2012-05-06 ENCOUNTER — Other Ambulatory Visit: Payer: Self-pay | Admitting: Physician Assistant

## 2012-05-06 DIAGNOSIS — N938 Other specified abnormal uterine and vaginal bleeding: Secondary | ICD-10-CM

## 2012-05-06 DIAGNOSIS — N83202 Unspecified ovarian cyst, left side: Secondary | ICD-10-CM

## 2012-05-06 DIAGNOSIS — R935 Abnormal findings on diagnostic imaging of other abdominal regions, including retroperitoneum: Secondary | ICD-10-CM

## 2012-05-06 DIAGNOSIS — D259 Leiomyoma of uterus, unspecified: Secondary | ICD-10-CM

## 2012-05-07 ENCOUNTER — Telehealth: Payer: Self-pay

## 2012-05-07 NOTE — Telephone Encounter (Signed)
LM for pt that we received referral from [facility] and call office to schedule appointment.

## 2012-05-28 ENCOUNTER — Ambulatory Visit (INDEPENDENT_AMBULATORY_CARE_PROVIDER_SITE_OTHER): Payer: BC Managed Care – PPO | Admitting: Obstetrics & Gynecology

## 2012-05-28 ENCOUNTER — Encounter: Payer: Self-pay | Admitting: Obstetrics & Gynecology

## 2012-05-28 VITALS — BP 115/73 | HR 65 | Resp 18 | Ht 64.0 in | Wt 287.0 lb

## 2012-05-28 DIAGNOSIS — Z139 Encounter for screening, unspecified: Secondary | ICD-10-CM

## 2012-05-28 DIAGNOSIS — Z01812 Encounter for preprocedural laboratory examination: Secondary | ICD-10-CM

## 2012-05-28 DIAGNOSIS — N924 Excessive bleeding in the premenopausal period: Secondary | ICD-10-CM

## 2012-05-28 LAB — POCT URINE PREGNANCY: Preg Test, Ur: NEGATIVE

## 2012-05-28 MED ORDER — MEDROXYPROGESTERONE ACETATE 10 MG PO TABS: ORAL_TABLET | ORAL | Status: DC

## 2012-05-28 NOTE — Progress Notes (Signed)
  Subjective:    Patient ID: Alicia White, female    DOB: Jun 09, 1960, 52 y.o.   MRN: [id number]  HPI  Pt is a 52 yo nulliparous female with long standing history of irregular menses.  Pt had long period of amenorrhea in there 30s.  She was placed on  OCPs in late 30s and had regular menses.  After stopping OCPs she maintained monthly menstrual cycle for 6-7 yrs.  After that menstrual cycle has become irregular.  Pt has been spotting since January with one episode of very heavy bleeding.  Pt is having hot flashes and flushes.  Review of Systems  Constitutional:       Hot flashes  Respiratory: Negative.   Cardiovascular: Negative.   Genitourinary: Positive for vaginal bleeding and menstrual problem.       Objective:   Physical Exam  Vitals reviewed. Constitutional: She is oriented to person, place, and time. She appears well-developed and well-nourished. No distress.  HENT:  Head: Normocephalic and atraumatic.  Eyes: Conjunctivae are normal.  Pulmonary/Chest: Effort normal.  Abdominal: Soft. She exhibits no distension and no mass. There is no tenderness. There is no rebound and no guarding.  Genitourinary: Vagina normal and uterus normal. No vaginal discharge found.  No adnexal masses.  Exam limited by habitus  Musculoskeletal: She exhibits no tenderness.  Neurological: She is alert and oriented to person, place, and time.  Skin: Skin is warm and dry.  Psychiatric: She has a normal mood and affect.          Assessment & Plan:  52 yo female with abnormal uterine bleeding  1-Endometrial bipsy today. 2- Bilateral ovarian cysts--most likely benign; willl get rpt [place] in 4 weeks 3-Lining was 15 mm.  If biopsy negative and bleeding still occurs, will get saline sono at time of sono for ovarian cysts 4-Provera x 10 days 5-Will call pt with results of biopsy.  ENDOMETRIAL BIOPSY     The indications for endometrial biopsy were reviewed.   Risks of the biopsy including cramping,  bleeding, infection, uterine perforation, inadequate specimen and need for additional procedures  were discussed. The patient states she understands and agrees to undergo procedure today. Consent was signed. Time out was performed. Urine HCG was negative. A sterile speculum was placed in the patient's vagina and the cervix was prepped with Betadine. A single-toothed tenaculum was placed on the anterior lip of the cervix to stabilize it. The 3 mm pipelle was introduced into the endometrial cavity without difficulty to a depth of 8.5 cm, and a moderate amount of tissue was obtained and sent to pathology. The instruments were removed from the patient's vagina. Minimal bleeding from the cervix was noted. The patient tolerated the procedure well. Routine post-procedure instructions were given to the patient. The patient will follow up to review the results and for further management.

## 2012-05-30 DIAGNOSIS — N924 Excessive bleeding in the premenopausal period: Secondary | ICD-10-CM | POA: Insufficient documentation

## 2012-06-04 ENCOUNTER — Encounter: Payer: Self-pay | Admitting: Obstetrics & Gynecology

## 2012-06-04 ENCOUNTER — Ambulatory Visit (INDEPENDENT_AMBULATORY_CARE_PROVIDER_SITE_OTHER): Payer: BC Managed Care – PPO | Admitting: Obstetrics & Gynecology

## 2012-06-04 ENCOUNTER — Telehealth: Payer: Self-pay | Admitting: *Deleted

## 2012-06-04 VITALS — BP 139/72 | HR 72 | Resp 16 | Ht 64.0 in | Wt 287.0 lb

## 2012-06-04 DIAGNOSIS — C55 Malignant neoplasm of uterus, part unspecified: Secondary | ICD-10-CM

## 2012-06-04 NOTE — Progress Notes (Signed)
Pt presents for results of endometrial biopsy.    Endometrium, biopsy - POSITIVE FOR ENDOMETRIAL ADENOCARCINOMA. - SEE COMMENT. Microscopic Comment Although grade and typing are best determined on hysterectomy specimen, the features of the tumor present in the endometrial biopsy are consistent with a endometrial adenocarcinoma, endometrioid type, FIGO grade I. The adenocarcinoma appears to be arising in a background of complex atypical hyperplasia. Dr. Frederica Kuster has seen this case in consultation with agreement. (RAH:eps 06/03/12) Zandra Abts MD Pathologist, Electronic Signature (Case signed 06/03/2012) Specimen Gross and Clinical Information Pt has appt with Gyn Onc later this month.  Of note, the patient found out she had cancer via my chart when the system sent her an automated email about her upcoming appt in the cancer center.  Will follow this up with poffice of patient experience. Pt does not want to take IV contrast because her mother died from kidney failure secondary to IV contrast. Pt's pap and mammogram are uptodate per pt.  Pt's primary care MD manages this for her.

## 2012-06-04 NOTE — Telephone Encounter (Signed)
Appt made with Dr Melina [facility] Tuesday April 29 @ 10:30

## 2012-06-15 ENCOUNTER — Telehealth: Payer: Self-pay | Admitting: Gynecologic Oncology

## 2012-06-15 NOTE — Telephone Encounter (Signed)
Consult Note: Gyn-Onc  Consult was requested by Dr. Francis Dowse for the evaluation of Lonia Reasner 52 y.o. female  CC: Endometrial cancer  Assessment/Plan:  Ms. Kolby Myung  is a 52 y.o.  year old  With grade 1 endometroid endometrial cancer.    A detailed discussion was held with the patient and her family with regard to to her endometrial cancer diagnosis. We discussed the standard management options for uterine cancer which includes surgery followed possibly by adjuvant therapy depending on the results of surgery. The options for surgical management include a hysterectomy and removal of the tubes and ovaries possibly with removal of pelvic and para-aortic lymph nodes. A minimally invasive approach including a robotic hysterectomy or laparoscopic hysterectomy have benefits including shorter hospital stay, recovery time and better wound healing. The alternative approach is an open hysterectomy. The patient has been counseled about these surgical options and the risks of surgery in general including infection, bleeding, damage to surrounding structures (including bowel, bladder, ureters, nerves or vessels), and the postoperative risks of PE/ DVT, and lymphedema. I extensively reviewed the additional risks of robotic hysterectomy including possible need for conversion to open laparotomy.  I discussed positioning during surgery of trendelenberg and risks of minor facial swelling and care we take in preoperative positioning.  After counseling and consideration of her options, she desires to proceed with **  She will be seen by anesthesia for preoperative clearance and discussion of postoperative pain management.  She was given the opportunity to ask questions, which were answered to her satisfaction, and she is agreement with the above mentioned plan of care.   HPI: Ms.Kirsten is a 52 y.o.    05/28/12 Endometrium, biopsy- POSITIVE FOR ENDOMETRIAL ADENOCARCINOMA. The features of the tumor present in the  endometrial biopsy are consistent with a endometrial adenocarcinoma, endometrioid type, FIGO grade I. The adenocarcinoma appears to be arising in a background of complex atypical hyperplasia.   Current Meds:  Outpatient Encounter Prescriptions as of 06/15/2012  Medication Sig Dispense Refill  . medroxyPROGESTERone (PROVERA) 10 MG tablet Take one tab for 3 days if bleeding not stopping then take 2 tab for the next 7 days.  17 tablet  0  . mupirocin ointment (BACTROBAN) 2 % Apply topically 3 (three) times daily.  22 g  0  . spironolactone (ALDACTONE) 50 MG tablet Take 1 tablet (50 mg total) by mouth 2 (two) times daily.  60 tablet  3   No facility-administered encounter medications on file as of 06/15/2012.    Allergy: No Known Allergies  Social Hx:   History   Social History  . Marital Status: Single    Spouse Name: N/A    Number of Children: N/A  . Years of Education: N/A   Occupational History  . Not on file.   Social History Main Topics  . Smoking status: Never Smoker   . Smokeless tobacco: Never Used  . Alcohol Use: No  . Drug Use: No  . Sexually Active: Yes   Other Topics Concern  . Not on file   Social History Narrative  . No narrative on file    Past Surgical Hx:  Past Surgical History  Procedure Laterality Date  . Dental surgery      Past Medical Hx:  Past Medical History  Diagnosis Date  . Heart murmur   . Asthma   . Pneumonia   . Fibroids   . Ovarian cyst     Past Gynecological History:  ** Patient's last menstrual  period was 05/21/2012.  Family Hx:  Family History  Problem Relation Age of Onset  . Cancer Mother     lymphoma  . Hyperlipidemia Father   . Stroke Father   . Heart attack Maternal Grandmother   . Heart attack Maternal Grandfather   . Heart attack Paternal Grandfather   . Stroke Paternal Grandmother   . High blood pressure Father   . Lymphoma Mother     Review of Systems:  Constitutional  Feels well,  ** Skin/Breast  No  rash, sores, jaundice, itching, dryness Cardiovascular  No chest pain, shortness of breath, or edema  Pulmonary  No cough or wheeze.  Gastro Intestinal  No nausea, vomitting, or diarrhoea. No bright red blood per rectum, no abdominal pain, change in bowel movement, or constipation.  Genito Urinary  No frequency, urgency, dysuria, ** Musculo Skeletal  No myalgia, arthralgia, joint swelling or pain  Neurologic  No weakness, numbness, change in gait,  Psychology  No depression, anxiety, insomnia.   Vitals:  @v @  Physical Exam: WD in NAD Neck  Supple NROM, without any enlargements.  Lymph Node Survey No cervical supraclavicular or inguinal adenopathy Cardiovascular  Pulse normal rate, regularity and rhythm. S1 and S2 normal.  Lungs  Clear to auscultation bilateraly, without wheezes/crackles/rhonchi. Good air movement.  Skin  No rash/lesions/breakdown  Psychiatry  Alert and oriented to person, place, and time  Abdomen  Normoactive bowel sounds, abdomen soft, non-tender and obese. Surgical  sites intact without evidence of hernia.  Back No CVA tenderness Genito Urinary  Vulva/vagina: Normal external female genitalia.  No lesions. No discharge or bleeding.  Bladder/urethra:  No lesions or masses  Vagina: **  Cervix: Normal appearing, no lesions.  Uterus: Small, mobile, no parametrial involvement or nodularity.  Adnexa: No palpable masses. Rectal  Good tone, no masses no cul de sac nodularity.  Extremities  No bilateral cyanosis, clubbing or edema.   Laurette Schimke, MD, PhD 06/15/2012, 8:55 PM

## 2012-06-16 ENCOUNTER — Encounter: Payer: Self-pay | Admitting: Gynecologic Oncology

## 2012-06-16 ENCOUNTER — Ambulatory Visit: Payer: BC Managed Care – PPO | Attending: Gynecologic Oncology | Admitting: Gynecologic Oncology

## 2012-06-16 VITALS — BP 122/74 | HR 80 | Temp 98.9°F | Resp 18 | Ht 64.61 in | Wt 285.0 lb

## 2012-06-16 DIAGNOSIS — C541 Malignant neoplasm of endometrium: Secondary | ICD-10-CM

## 2012-06-16 DIAGNOSIS — Z6841 Body Mass Index (BMI) 40.0 and over, adult: Secondary | ICD-10-CM | POA: Insufficient documentation

## 2012-06-16 DIAGNOSIS — C549 Malignant neoplasm of corpus uteri, unspecified: Secondary | ICD-10-CM | POA: Insufficient documentation

## 2012-06-16 DIAGNOSIS — D259 Leiomyoma of uterus, unspecified: Secondary | ICD-10-CM | POA: Insufficient documentation

## 2012-06-16 MED ORDER — MEDROXYPROGESTERONE ACETATE 10 MG PO TABS: 10.0000 mg | ORAL_TABLET | Freq: Every day | ORAL | Status: DC

## 2012-06-16 NOTE — Patient Instructions (Signed)
Continue Provera 10 mg daily.   We will contact you regarding the date and location of endometrial cancer surgery procedure.  The plan is for an attempt for uterine cancer staging a minimally invasive fashion. It is not feasible strong consideration will be given to placement of a Mirena IUD to decrease the risks associated with abdominal staging.    Thank you very much [name] for allowing me to provide care for you today.  I appreciate your confidence in choosing our Gynecologic Oncology team.  If you have any questions about your visit today please call our office and we will get back to you as soon as possible.  Maryclare Labrador. Javanna Patin MD., PhD Gynecologic Oncology

## 2012-06-16 NOTE — Progress Notes (Signed)
Consult Note: Gyn-Onc  Consult was requested by Dr. Francis Dowse for the evaluation of Alicia White 52 y.o. female  CC: Endometrial cancer  Assessment/Plan:  Alicia. Alicia White  is a 52 y.o.  year old BMI of 51 with grade 1 endometroid endometrial cancer.    A detailed discussion was held with the patient and her family with regard to to her endometrial cancer diagnosis. We discussed the standard management options for uterine cancer which includes surgery followed possibly by adjuvant therapy depending on the results of surgery. The options for surgical management include a hysterectomy and removal of the tubes and ovaries possibly with removal of pelvic and para-aortic lymph nodes. A minimally invasive approach including a robotic hysterectomy or laparoscopic hysterectomy have benefits including shorter hospital stay, recovery time and better wound healing. The alternative approach is an open hysterectomy or use of Mirena IUD.   The patient has been counseled about these surgical options and the risks of surgery in general including infection, bleeding, damage to surrounding structures (including bowel, bladder, ureters, nerves or vessels), and the postoperative risks of PE/ DVT, and lymphedema. I extensively reviewed the additional risks of robotic hysterectomy including possible need for conversion to open laparotomy.  I discussed positioning during surgery of trendelenberg and risks of minor facial swelling and care we take in preoperative positioning.  The patient was advised that if she's not tolerant of a tilt test or hysterectomy did not appear feasible , given her morbid obesity strong consideration will be given to performing a dilation and curettage with placement of a Mirena IUD.  We would delay surgical management until there has been significant weight loss .  The patient and her friends were in agreement with this plan and all their questions were answered to their satisfaction.   We'll  contact Alicia White regarding the earliest opportunity for robotic endometrial cancer staging either here or at [facility].  I've asked her to continue Provera until 1 day prior to the date of surgery.   HPI: Alicia White is a 52 y.o. : Noted vaginal spotting on 02/18/2012. The bleeding became heavy approximately 4 weeks ago but she presented to Dr. Penne Lash and an ultrasound was collected  UTZ 04/2012 Uterus: Measures 9.3 x 5.3 x 6.1 cm. An isoechoic fibroid measures  2.5 x 1.5 x 2.0 cm in the anterior uterine body.  Endometrium: Measures 15 mm in thickness, with internal color flow  noted.   Right ovary: Measures 2.3 x 1.5 x 1.6 cm, and is adjacent to but  likely separate from a 2.0 x 1.7 x 1.4 cm cystic lesion with an  internal fold or septation remote from the ovarian parenchyma  Left ovary: Measures 3.2 x 2.2 x 2.3 cm, which includes a 1.8 x 1.7  x 1.4 cm cystic lesion with faint internal echoes.     05/28/12 Endometrium, biopsy- POSITIVE FOR ENDOMETRIAL ADENOCARCINOMA. The features of the tumor present in the endometrial biopsy are consistent with a endometrial adenocarcinoma, endometrioid type, FIGO grade I. The adenocarcinoma appears to be arising in a background of complex atypical hyperplasia.  Alicia White has been on Provera 10 mg daily since her diagnosis. She reports that the bleeding has subsided.   Current Meds:  Outpatient Encounter Prescriptions as of 06/16/2012  Medication Sig Dispense Refill  . medroxyPROGESTERone (PROVERA) 10 MG tablet Take one tab for 3 days if bleeding not stopping then take 2 tab for the next 7 days.  17 tablet  0  . mupirocin ointment (BACTROBAN) 2 %  Apply topically 3 (three) times daily.  22 g  0  . spironolactone (ALDACTONE) 50 MG tablet Take 1 tablet (50 mg total) by mouth 2 (two) times daily.  60 tablet  3   No facility-administered encounter medications on file as of 06/16/2012.    Allergy: No Known Allergies  Social Hx:   History   Social History  . Marital  Status: Single    Spouse Name: N/A    Number of Children: N/A  . Years of Education: N/A   Occupational History  . Not on file.   Social History Main Topics  . Smoking status: Never Smoker   . Smokeless tobacco: Never Used  . Alcohol Use: Yes     Comment: occassional  . Drug Use: No  . Sexually Active: Yes   Other Topics Concern  . Not on file   Social History Narrative  . No narrative on file    Past Surgical Hx:  Past Surgical History  Procedure Laterality Date  . Dental surgery      Past Medical Hx:  Past Medical History  Diagnosis Date  . Heart murmur   . Asthma   . Pneumonia   . Fibroids   . Ovarian cyst     Past Gynecological History: Gravida 0 menarche at age 23 with irregular menses since. Patient gives a history of PCO reports hirsutism since age of 17Patient's last menstrual period was 06/14/2012. Pap February 2014 within normal limits  Family Hx:  Family History  Problem Relation Age of Onset  . Cancer Mother     lymphoma  . Hyperlipidemia Father   . Stroke Father   . Heart attack Maternal Grandmother   . Heart attack Maternal Grandfather   . Heart attack Paternal Grandfather   . Stroke Paternal Grandmother   . High blood pressure Father   . Lymphoma Mother     Review of Systems:  Constitutional  Feels well, facial and lower abdominal hirsutism noted Cardiovascular  No chest pain, shortness of breath, or edema  Pulmonary  No cough or wheeze.  Gastro Intestinal  No nausea, vomitting, or diarrhoea. No bright red blood per rectum, no abdominal pain, change in bowel movement, or constipation.  Genito Urinary  No frequency, urgency, dysuria reports continued vaginal bleeding  Musculo Skeletal  No myalgia, arthralgia, joint swelling or pain  Neurologic  No weakness, numbness, change in gait,  Psychology  No depression, anxiety, insomnia.   Vitals:  BP 122/74  Pulse 80  Temp(Src) 98.9 F (37.2 C) (Oral)  Resp 18  Ht 5' 4.61" (1.641 m)   Wt 285 lb (129.275 kg)  BMI 48.01 kg/m2  LMP 06/14/2012  Physical Exam: WD in NAD adiposity predominantly within the abdomen. Neck  Supple NROM, without any enlargements.  Lymph Node Survey No cervical supraclavicular or inguinal adenopathy Cardiovascular  Pulse normal rate, regularity and rhythm. S1 and S2 normal.  Lungs  Clear to auscultation bilateraly, without wheezes/crackles/rhonchi. Distant breath sounds at bases.  Skin  No rash/lesions/breakdown  Psychiatry  Alert and oriented to person, place, and time  Abdomen  Normoactive bowel sounds, abdomen soft, non-tender and obese.   Back No CVA tenderness Genito Urinary  Vulva/vagina: Normal external female genitalia.  No lesions. No discharge or bleeding.  Bladder/urethra:  No lesions or masses  Vagina: Is noted within the vaginal vault  Cervix: Normal appearing, no lesions. Well supported  Uterus: Small, mobile, no parametrial involvement or nodularity.  Adnexa: No palpable masses. Rectal  Good  tone, no masses no cul de sac nodularity.  Extremities  No bilateral cyanosis, clubbing. 1-2+ edema.   Laurette Schimke, MD, PhD 06/16/2012, 10:38 AM

## 2012-07-01 ENCOUNTER — Ambulatory Visit: Payer: BC Managed Care – PPO | Admitting: Obstetrics & Gynecology

## 2012-07-01 ENCOUNTER — Ambulatory Visit: Payer: BC Managed Care – PPO | Admitting: Physician Assistant

## 2012-07-01 HISTORY — PX: ABDOMINAL HYSTERECTOMY: SHX81

## 2012-07-02 DIAGNOSIS — Z9889 Other specified postprocedural states: Secondary | ICD-10-CM | POA: Insufficient documentation

## 2012-08-14 DIAGNOSIS — C541 Malignant neoplasm of endometrium: Secondary | ICD-10-CM | POA: Insufficient documentation

## 2012-09-18 ENCOUNTER — Ambulatory Visit (INDEPENDENT_AMBULATORY_CARE_PROVIDER_SITE_OTHER): Payer: BC Managed Care – PPO | Admitting: Physician Assistant

## 2012-09-18 ENCOUNTER — Encounter: Payer: Self-pay | Admitting: Physician Assistant

## 2012-09-18 VITALS — BP 126/70 | HR 75 | Wt 291.0 lb

## 2012-09-18 DIAGNOSIS — R140 Abdominal distension (gaseous): Secondary | ICD-10-CM

## 2012-09-18 DIAGNOSIS — R609 Edema, unspecified: Secondary | ICD-10-CM

## 2012-09-18 DIAGNOSIS — L680 Hirsutism: Secondary | ICD-10-CM

## 2012-09-18 DIAGNOSIS — R141 Gas pain: Secondary | ICD-10-CM

## 2012-09-18 DIAGNOSIS — E669 Obesity, unspecified: Secondary | ICD-10-CM

## 2012-09-18 DIAGNOSIS — R143 Flatulence: Secondary | ICD-10-CM

## 2012-09-18 MED ORDER — LORCASERIN HCL 10 MG PO TABS: 10.0000 mg | ORAL_TABLET | Freq: Two times a day (BID) | ORAL | Status: DC

## 2012-09-18 MED ORDER — SPIRONOLACTONE 50 MG PO TABS: 50.0000 mg | ORAL_TABLET | Freq: Two times a day (BID) | ORAL | Status: DC

## 2012-09-18 NOTE — Patient Instructions (Addendum)
Start belviq.

## 2012-09-18 NOTE — Progress Notes (Signed)
  Subjective:    Patient ID: Alicia White, female    DOB: 31-Jan-1961, 52 y.o.   MRN: [id number]  HPI Patient comes in today to discuss weight loss options.   She recently had a hysterectomy due to stage 1 endometrial cancer. She is doing great since surgery. She is concerned because she feels like she continues to gain weight. She has felt even more bloated since surgery in April. She is not exercising right now and admits to not keeping to a balanced diet.   She also needs refill on spirolactone. She is uses it to help with hair growth but also she likes how it helps with lower leg swelling.     Review of Systems     Objective:   Physical Exam  Constitutional: She is oriented to person, place, and time. She appears well-developed and well-nourished.  Obesity.  HENT:  Head: Normocephalic and atraumatic.  Coarse hair growth on chin.  Cardiovascular: Normal rate, regular rhythm and normal heart sounds.   Pulmonary/Chest: Effort normal and breath sounds normal. She has no wheezes.  Neurological: She is alert and oriented to person, place, and time.  Skin: Skin is warm and dry.  Psychiatric: She has a normal mood and affect. Her behavior is normal.          Assessment & Plan:  Obesity-bloating could also be from surgery and excess gas. Consider probiotics daily and a gluten free 2 week period to see if could have  Gluten intolerance.Discussion had between prescription options of phentermine/Belviq/Qysmia.she decided Belviq she would like to try. Gave rx. We did not have samples in office today but will call when we get them in with savings card. Encouraged pt to couple with diet and exercise. Keep to a 1200-1500 calorie diet. Exercise at least 150 minutes a week. Pt does have a appt with nutritionist in the next week or so. Follow up in 3 months.   Hirsutism/edema- Will refill spironolactone. Discussed keeping feet elevated when get home for work.  Spent 30 minutes with patient and  greater than 50 percent of visit spent counseling patient regarding weight.

## 2012-09-19 DIAGNOSIS — L680 Hirsutism: Secondary | ICD-10-CM | POA: Insufficient documentation

## 2012-09-19 DIAGNOSIS — E669 Obesity, unspecified: Secondary | ICD-10-CM | POA: Insufficient documentation

## 2012-09-23 ENCOUNTER — Ambulatory Visit: Payer: BC Managed Care – PPO | Admitting: Physician Assistant

## 2012-09-24 ENCOUNTER — Telehealth: Payer: Self-pay | Admitting: *Deleted

## 2012-09-24 NOTE — Telephone Encounter (Signed)
Prior Berkley Harvey was approved for belviq.  08/24/12-12/22/12.

## 2012-12-04 ENCOUNTER — Encounter: Payer: Self-pay | Admitting: Emergency Medicine

## 2012-12-04 ENCOUNTER — Emergency Department (INDEPENDENT_AMBULATORY_CARE_PROVIDER_SITE_OTHER)
Admission: EM | Admit: 2012-12-04 | Discharge: 2012-12-04 | Disposition: A | Discharge status: Home / Self Care | Payer: BC Managed Care – PPO | Source: Home / Self Care | Attending: Family Medicine | Admitting: Family Medicine

## 2012-12-04 ENCOUNTER — Emergency Department (INDEPENDENT_AMBULATORY_CARE_PROVIDER_SITE_OTHER): Payer: BC Managed Care – PPO

## 2012-12-04 DIAGNOSIS — S43402A Unspecified sprain of left shoulder joint, initial encounter: Secondary | ICD-10-CM

## 2012-12-04 DIAGNOSIS — M25519 Pain in unspecified shoulder: Secondary | ICD-10-CM

## 2012-12-04 DIAGNOSIS — IMO0002 Reserved for concepts with insufficient information to code with codable children: Secondary | ICD-10-CM

## 2012-12-04 MED ORDER — CYCLOBENZAPRINE HCL 10 MG PO TABS: 10.0000 mg | ORAL_TABLET | Freq: Two times a day (BID) | ORAL | Status: DC | PRN
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
## 2012-12-24 ENCOUNTER — Other Ambulatory Visit: Payer: Self-pay

## 2013-01-05 ENCOUNTER — Ambulatory Visit (INDEPENDENT_AMBULATORY_CARE_PROVIDER_SITE_OTHER): Payer: BC Managed Care – PPO

## 2013-01-05 ENCOUNTER — Ambulatory Visit (INDEPENDENT_AMBULATORY_CARE_PROVIDER_SITE_OTHER): Payer: BC Managed Care – PPO | Admitting: Sports Medicine

## 2013-01-05 ENCOUNTER — Encounter: Payer: Self-pay | Admitting: Sports Medicine

## 2013-01-05 VITALS — BP 120/65 | HR 77 | Wt 291.0 lb

## 2013-01-05 DIAGNOSIS — S134XXA Sprain of ligaments of cervical spine, initial encounter: Secondary | ICD-10-CM

## 2013-01-05 DIAGNOSIS — M542 Cervicalgia: Secondary | ICD-10-CM

## 2013-01-05 DIAGNOSIS — S139XXA Sprain of joints and ligaments of unspecified parts of neck, initial encounter: Secondary | ICD-10-CM

## 2013-01-05 MED ORDER — TRAMADOL HCL 50 MG PO TABS: ORAL_TABLET | ORAL | Status: DC

## 2013-01-05 MED ORDER — PREDNISONE 50 MG PO TABS: ORAL_TABLET | ORAL | Status: DC
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
## 2013-01-06 ENCOUNTER — Ambulatory Visit: Payer: BC Managed Care – PPO | Admitting: Physician Assistant

## 2013-01-28 ENCOUNTER — Ambulatory Visit (INDEPENDENT_AMBULATORY_CARE_PROVIDER_SITE_OTHER): Payer: BC Managed Care – PPO | Admitting: Sports Medicine

## 2013-01-28 ENCOUNTER — Ambulatory Visit (INDEPENDENT_AMBULATORY_CARE_PROVIDER_SITE_OTHER): Payer: Self-pay

## 2013-01-28 ENCOUNTER — Encounter: Payer: Self-pay | Admitting: Sports Medicine

## 2013-01-28 VITALS — BP 112/70 | HR 82 | Wt 294.0 lb

## 2013-01-28 DIAGNOSIS — M545 Low back pain, unspecified: Secondary | ICD-10-CM

## 2013-01-28 DIAGNOSIS — S134XXD Sprain of ligaments of cervical spine, subsequent encounter: Secondary | ICD-10-CM

## 2013-01-28 DIAGNOSIS — Z5189 Encounter for other specified aftercare: Secondary | ICD-10-CM

## 2013-01-28 MED ORDER — PREDNISONE (PAK) 10 MG PO TABS: ORAL_TABLET | ORAL | Status: DC
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
## 2013-01-28 NOTE — Assessment & Plan Note (Signed)
With dorsal scapular radicular symptoms, resolved with conservative measures.

## 2013-02-25 ENCOUNTER — Ambulatory Visit: Payer: BC Managed Care – PPO | Attending: Gynecologic Oncology | Admitting: Gynecologic Oncology

## 2013-02-25 ENCOUNTER — Encounter: Payer: Self-pay | Admitting: Gynecologic Oncology

## 2013-02-25 ENCOUNTER — Encounter: Payer: Self-pay | Admitting: Family Medicine

## 2013-02-25 ENCOUNTER — Ambulatory Visit (INDEPENDENT_AMBULATORY_CARE_PROVIDER_SITE_OTHER): Payer: BC Managed Care – PPO | Admitting: Family Medicine

## 2013-02-25 VITALS — BP 123/73 | HR 114 | Temp 100.6°F | Wt 294.0 lb

## 2013-02-25 VITALS — BP 137/73 | HR 112 | Temp 101.3°F | Resp 16 | Ht 64.61 in | Wt 294.0 lb

## 2013-02-25 DIAGNOSIS — C541 Malignant neoplasm of endometrium: Secondary | ICD-10-CM

## 2013-02-25 DIAGNOSIS — Z9079 Acquired absence of other genital organ(s): Secondary | ICD-10-CM | POA: Insufficient documentation

## 2013-02-25 DIAGNOSIS — E669 Obesity, unspecified: Secondary | ICD-10-CM | POA: Insufficient documentation

## 2013-02-25 DIAGNOSIS — R6889 Other general symptoms and signs: Secondary | ICD-10-CM | POA: Insufficient documentation

## 2013-02-25 DIAGNOSIS — Z6841 Body Mass Index (BMI) 40.0 and over, adult: Secondary | ICD-10-CM | POA: Insufficient documentation

## 2013-02-25 DIAGNOSIS — C549 Malignant neoplasm of corpus uteri, unspecified: Secondary | ICD-10-CM | POA: Insufficient documentation

## 2013-02-25 DIAGNOSIS — Z90710 Acquired absence of both cervix and uterus: Secondary | ICD-10-CM | POA: Insufficient documentation

## 2013-02-25 DIAGNOSIS — J45909 Unspecified asthma, uncomplicated: Secondary | ICD-10-CM | POA: Insufficient documentation

## 2013-02-25 DIAGNOSIS — J020 Streptococcal pharyngitis: Secondary | ICD-10-CM

## 2013-02-25 DIAGNOSIS — R509 Fever, unspecified: Secondary | ICD-10-CM

## 2013-02-25 LAB — POCT INFLUENZA A/B: INFLUENZA A, POC: NEGATIVE

## 2013-02-25 MED ORDER — PENICILLIN V POTASSIUM 500 MG PO TABS: ORAL_TABLET | ORAL | Status: AC

## 2013-02-25 NOTE — Progress Notes (Signed)
CC: Alicia White is a 53 y.o. female is here for Fever   Subjective: HPI:  Complains of 2 days of fevers maximum temperature 101.5 yesterday along with a scratchy throat on the right side of the throat. Temperature is improved with acetaminophen, throat discomfort is improved with cold beverages. Throat discomfort is described as mild/moderate in severity nonradiating. Symptoms are present all hours of the day she denies cough, shortness of breath, facial pain, nasal congestion, rashes, confusion nor headache   Review Of Systems Outlined In HPI  Past Medical History  Diagnosis Date  . Heart murmur   . Asthma   . Pneumonia   . Fibroids   . Ovarian cyst   . Endometrial carcinoma      Family History  Problem Relation Age of Onset  . Cancer Mother     lymphoma  . Hyperlipidemia Father   . Stroke Father   . Heart attack Maternal Grandmother   . Heart attack Maternal Grandfather   . Heart attack Paternal Grandfather   . Stroke Paternal Grandmother   . High blood pressure Father   . Lymphoma Mother      History  Substance Use Topics  . Smoking status: Never Smoker   . Smokeless tobacco: Never Used  . Alcohol Use: Yes     Comment: occassional     Objective: Filed Vitals:   02/25/13 1135  BP: 123/73  Pulse: 114  Temp: 100.6 F (38.1 C)    General: Alert and Oriented, No Acute Distress HEENT: Pupils equal, round, reactive to light. Conjunctivae clear.  External ears unremarkable, canals clear with intact TMs with appropriate landmarks.  Middle ear appears open without effusion. Pink inferior turbinates.  Moist mucous membranes, pharynx with moderate tonsillar hypertrophy bilaterally, right tonsil has exudate white, uvula is midline.  Right anterior cervical chain lymphadenopathy. Lungs: Clear to auscultation bilaterally, no wheezing/ronchi/rales.  Comfortable work of breathing. Good air movement. Mental Status: No depression, anxiety, nor agitation. Skin: Warm and  dry.  Assessment & Plan: Alicia White was seen today for fever.  Diagnoses and associated orders for this visit:  Fever, unspecified - POCT Influenza A/B  Strep pharyngitis - penicillin v potassium (VEETID) 500 MG tablet; One by mouth every 12 hours for ten days, take 1 hour before or 2 hours after meals.    Strep pharyngitis: Start penicillin, rapid flu was negative. Ibuprofen for pain  Return if symptoms worsen or fail to improve.

## 2013-02-25 NOTE — Patient Instructions (Signed)
F/U with Gyn onc in 6 months F/U with Dr Gala Romney in 12 months Influenza, Adult Influenza ("the flu") is a viral infection of the respiratory tract. It occurs more often in winter months because people spend more time in close contact with one another. Influenza can make you feel very sick. Influenza easily spreads from person to person (contagious). CAUSES  Influenza is caused by a virus that infects the respiratory tract. You can catch the virus by breathing in droplets from an infected person's cough or sneeze. You can also catch the virus by touching something that was recently contaminated with the virus and then touching your mouth, nose, or eyes. SYMPTOMS  Symptoms typically last 4 to 10 days and may include:  Fever.  Chills.  Headache, body aches, and muscle aches.  Sore throat.  Chest discomfort and cough.  Poor appetite.  Weakness or feeling tired.  Dizziness.  Nausea or vomiting. DIAGNOSIS  Diagnosis of influenza is often made based on your history and a physical exam. A nose or throat swab test can be done to confirm the diagnosis. RISKS AND COMPLICATIONS You may be at risk for a more severe case of influenza if you smoke cigarettes, have diabetes, have chronic heart disease (such as heart failure) or lung disease (such as asthma), or if you have a weakened immune system. Elderly people and pregnant women are also at risk for more serious infections. The most common complication of influenza is a lung infection (pneumonia). Sometimes, this complication can require emergency medical care and may be life-threatening. PREVENTION  An annual influenza vaccination (flu shot) is the best way to avoid getting influenza. An annual flu shot is now routinely recommended for all adults in the [place] TREATMENT  In mild cases, influenza goes away on its own. Treatment is directed at relieving symptoms. For more severe cases, your caregiver may prescribe antiviral medicines to shorten the  sickness. Antibiotic medicines are not effective, because the infection is caused by a virus, not by bacteria. HOME CARE INSTRUCTIONS  Only take over-the-counter or prescription medicines for pain, discomfort, or fever as directed by your caregiver.  Use a cool mist humidifier to make breathing easier.  Get plenty of rest until your temperature returns to normal. This usually takes 3 to 4 days.  Drink enough fluids to keep your urine clear or pale yellow.  Cover your mouth and nose when coughing or sneezing, and wash your hands well to avoid spreading the virus.  Stay home from work or school until your fever has been gone for at least 1 full day. SEEK MEDICAL CARE IF:   You have chest pain or a deep cough that worsens or produces more mucus.  You have nausea, vomiting, or diarrhea. SEEK IMMEDIATE MEDICAL CARE IF:   You have difficulty breathing, shortness of breath, or your skin or nails turn bluish.  You have severe neck pain or stiffness.  You have a severe headache, facial pain, or earache.  You have a worsening or recurring fever.  You have nausea or vomiting that cannot be controlled. MAKE SURE YOU:  Understand these instructions.  Will watch your condition.  Will get help right away if you are not doing well or get worse. Document Released: 02/02/2000 Document Revised: 08/06/2011 Document Reviewed: 05/06/2011 [facility] Patient Information 2014 [place], [place].

## 2013-02-25 NOTE — Progress Notes (Signed)
OFFICE VISIT: GYNECOLOGIC ONCOLOGY   CHIEF COMPLAINT: Surveillance endometrial cancer  ASSESSMENT/PLAN Alicia White is a 53 y.o. 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 Counseled that no adjuvant therapy is required. Counseled on the signs and symptoms of recurrence F/U with Dr.Leggett in 6 months.  Pap at that visit F/U with Gyn Oncology 12 months  Cautioned about influenza  History of Present Illness: Alicia White is a 53 y.o.  woman who presented with with abnormal uterine bleeding in 01/2012. A pelvic US in 04/2012 showed a 87mm endometrial stripe with a 9cm uterus. Endometrial biopsy on 05/28/12 showed grade 1 endometrial adenocarcinoma in a background of complex atypical hyperplasia. She was given Provera 10mg  daily following her diagnosis   07/01/2012 [facility] Robotic-assisted total laparoscopic hysterectomy, bilateral salpingo-oophorectomy, bilateral pelvic lymphadenectomy Findings: Approximately 2-3cm anterior uterine fibroid. Normal fallopian tubes and ovaries. No evidence of extrauterine disease.    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  Pathology: Histologic type: Endometrioid adenocarcinoma  Histologic grade: FIGO grade 1 (architectural grade 1, nuclear grade 1)  Tumor site: Anterior and posterior endometrium, arising on a background of complex atypical hyperplasia Myometrial invasion: Noninvasive  Serosal involvement: Not identified  Lower uterine segment involvement: Not identified  Cervical involvement: Not identified Adnexal involvement: Not identified Other involved sites: Not identified Cervical/vaginal margin and distance: Widely negative, > 4 cm  Lymphovascular space invasion: Not identified  Lymph nodes, right pelvic - Thirty-two lymph nodes, negative for metastatic carcinoma (0/32) Lymph nodes, left pelvic - Three lymph nodes, negative for metastatic carcinoma (0/3).   Post operative care was  uncomplicated. Patient reports scratchy throat.  No diarrhea or emesis, no myalgia, no photophobia dry cough.  Past Medical History: Past Medical History  Diagnosis Date  . Asthma  Last exacerbation 2009  . Pneumonia  Several times, as child  . Fibroid uterus  . Polycystic ovarian syndrome  . Obesity  . Endometriosis  . Female infertility  . History of uterine fibroid   Past Surgical History: . Dental surgery  As child  . Myomectomy  ROBOTIC LAPAROSCOPY, HYSTERECTOMY WITH BILATERAL TOTAL PELVIC LYMPHADENECTOMY;  Family History  . Cancer Mother Lymphoma  . Hypertension Father  . Hyperlipidemia Father  . Stroke Father  . Maternal Grandmother MI  . Paternal Grandfather MI  . Stroke Paternal Grandmother   REVIEW OF SYMPTOMS Constitutional  Feels well no headache, no photophobia Cardiovascular  No chest pain, shortness of breath, or edema  Pulmonary  No cough or wheeze. Reports scratchy throat.  No sinus congestion Gastro Intestinal  No nausea, vomiting, or diarrhea. No bright red blood per rectum, no abdominal pain, change in bowel movement, no diarrhea, or constipation.  Genito Urinary  No frequency, urgency, dysuria, no vaginal bleeding Musculo Skeletal  No myalgia, arthralgia, joint swelling chronic low back pain Neurologic  No weakness, numbness, change in gait,   Vitals:BP 137/73  Pulse 112  Temp(Src) 101.3 F (38.5 C)  Resp 16  Ht 5' 4.61" (1.641 m)  Wt 294 lb (133.358 kg)  BMI 49.52 kg/m2  LMP 06/14/2012  Physical Exam: Constitutional: Well-developed, well-nourished female in no acute distress  Neurological: Alert and oriented to person, place, and time  Psychiatric: Mood and affect appropriate  Skin: No rashes or lesions  Neck: Supple without masses. Trachea is midline.   Lymphatics: No cervical, axillary, supraclavicular, or inguinal adenopathy noted  Respiratory: Clear to auscultation bilaterally. Good air movement with normal work of breathing.  Cardiovascular: Regular rate and rhythm. Extremities grossly normal, nontender with no edema;pulses regular  Gastrointestinal: Soft, nontender, nondistended. No masses or hernias appreciated at port sites. Residual hyperpibmentation at the supraumbilical port site. No hepatosplenomegaly. No fluid wave. No rebound or guarding.   Genitourinary:  External Genitalia: Normal female genitalia  Urethral Meatus: Normal caliber and position  Urethra: Midline, no masses  Bladder: Well-suspended, NT  Vagina: Hypoestrogenic, no lesions cuff intact, no bleeding or discharge no pelvic masses Rectal:  Good tone no masses Ext:  No clubbing cyanosis or edema

## 2013-03-01 ENCOUNTER — Encounter: Payer: Self-pay | Admitting: Physician Assistant

## 2013-03-01 ENCOUNTER — Ambulatory Visit (INDEPENDENT_AMBULATORY_CARE_PROVIDER_SITE_OTHER): Payer: BC Managed Care – PPO | Admitting: Physician Assistant

## 2013-03-01 ENCOUNTER — Ambulatory Visit: Payer: BC Managed Care – PPO | Admitting: Sports Medicine

## 2013-03-01 VITALS — BP 126/67 | HR 105 | Temp 101.0°F | Wt 288.0 lb

## 2013-03-01 DIAGNOSIS — J020 Streptococcal pharyngitis: Secondary | ICD-10-CM

## 2013-03-01 DIAGNOSIS — R509 Fever, unspecified: Secondary | ICD-10-CM

## 2013-03-01 MED ORDER — METHYLPREDNISOLONE SODIUM SUCC 125 MG IJ SOLR
125.0000 mg | Freq: Once | INTRAMUSCULAR | Status: AC
  Administered 2013-03-01: 125 mg via INTRAMUSCULAR

## 2013-03-01 MED ORDER — PREDNISONE 20 MG PO TABS: ORAL_TABLET | ORAL | Status: DC

## 2013-03-01 MED ORDER — AMOXICILLIN-POT CLAVULANATE 875-125 MG PO TABS: 1.0000 | ORAL_TABLET | Freq: Two times a day (BID) | ORAL | Status: DC

## 2013-03-01 NOTE — Addendum Note (Signed)
Addended by: Beatris Ship L on: 03/01/2013 10:00 AM   Modules accepted: Orders

## 2013-03-01 NOTE — Patient Instructions (Signed)
Strep Throat  Strep throat is an infection of the throat caused by a bacteria named Streptococcus pyogenes. Your caregiver may call the infection streptococcal "tonsillitis" or "pharyngitis" depending on whether there are signs of inflammation in the tonsils or back of the throat. Strep throat is most common in children aged 51 15 years during the cold months of the year, but it can occur in people of any age during any season. This infection is spread from person to person (contagious) through coughing, sneezing, or other close contact.  SYMPTOMS   · Fever or chills.  · Painful, swollen, red tonsils or throat.  · Pain or difficulty when swallowing.  · White or yellow spots on the tonsils or throat.  · Swollen, tender lymph nodes or "glands" of the neck or under the jaw.  · Red rash all over the body (rare).  DIAGNOSIS   Many different infections can cause the same symptoms. A test must be done to confirm the diagnosis so the right treatment can be given. A "rapid strep test" can help your caregiver make the diagnosis in a few minutes. If this test is not available, a light swab of the infected area can be used for a throat culture test. If a throat culture test is done, results are usually available in a day or two.  TREATMENT   Strep throat is treated with antibiotic medicine.  HOME CARE INSTRUCTIONS   · Gargle with 1 tsp of salt in 1 cup of warm water, 3 4 times per day or as needed for comfort.  · Family members who also have a sore throat or fever should be tested for strep throat and treated with antibiotics if they have the strep infection.  · Make sure everyone in your household washes their hands well.  · Do not share food, drinking cups, or personal items that could cause the infection to spread to others.  · You may need to eat a soft food diet until your sore throat gets better.  · Drink enough water and fluids to keep your urine clear or pale yellow. This will help prevent dehydration.  · Get plenty of  rest.  · Stay home from school, daycare, or work until you have been on antibiotics for 24 hours.  · Only take over-the-counter or prescription medicines for pain, discomfort, or fever as directed by your caregiver.  · If antibiotics are prescribed, take them as directed. Finish them even if you start to feel better.  SEEK MEDICAL CARE IF:   · The glands in your neck continue to enlarge.  · You develop a rash, cough, or earache.  · You cough up green, yellow-brown, or bloody sputum.  · You have pain or discomfort not controlled by medicines.  · Your problems seem to be getting worse rather than better.  SEEK IMMEDIATE MEDICAL CARE IF:   · You develop any new symptoms such as vomiting, severe headache, stiff or painful neck, chest pain, shortness of breath, or trouble swallowing.  · You develop severe throat pain, drooling, or changes in your voice.  · You develop swelling of the neck, or the skin on the neck becomes red and tender.  · You have a fever.  · You develop signs of dehydration, such as fatigue, dry mouth, and decreased urination.  · You become increasingly sleepy, or you cannot wake up completely.  Document Released: 02/02/2000 Document Revised: 01/22/2012 Document Reviewed: 04/05/2010  [employer]® Patient Information ©2014 [employer].

## 2013-03-01 NOTE — Progress Notes (Addendum)
   Subjective:    Patient ID: Alicia White, female    DOB: 1960/03/12, 53 y.o.   MRN: [id number]  HPI Patient presents to the clinic with ongoing fever, swollen tonsils, sore throat despite being treated with PCN 4 days ago by Dr. Ileene Rubens. Fevers at night get as high as 102. She is having difficultly drinking water and fluids. She cannot eat any solid food. Right side of neck have more pain and swollen glands.     Review of Systems     Objective:   Physical Exam  Constitutional: She is oriented to person, place, and time. She appears well-developed and well-nourished.  HENT:  Head: Normocephalic and atraumatic.  Right Ear: External ear normal.  Left Ear: External ear normal.  Nose: Nose normal.  TM's clear bilaterally.   Oropharynx bilateral tonsils swollen almost touching 2+ hypertrophy. White exudate on right side.   Eyes: Conjunctivae are normal. Right eye exhibits no discharge. Left eye exhibits no discharge.  Neck:  Right side of neck cervical lymphadenopathy that is very tender to touch.   Cardiovascular: Normal rate, regular rhythm and normal heart sounds.   Pulmonary/Chest: Effort normal and breath sounds normal. She has no wheezes.  Neurological: She is alert and oriented to person, place, and time.  Skin: Skin is warm and dry.  Psychiatric: She has a normal mood and affect. Her behavior is normal.          Assessment & Plan:  Strep throat/fever/acute pharyngitis- clearly pt is not responding to PCN. Will treat with augmentin for 10 days. It could be that this is a viral infection. No flu symptoms. Could consider mono testing but will see if treatment today improves symptoms and if doesn't can consider. Given shot of solu medrol 125mg  in office today. Gave rx for prednisone for 5 days to help treat tonsilar swelling. Concerned about air way if tonsils continue to enlarge. Call if not improving or if symptoms worsen.

## 2013-03-03 ENCOUNTER — Telehealth: Payer: Self-pay | Admitting: *Deleted

## 2013-03-03 NOTE — Telephone Encounter (Signed)
Ok to take prednisone with jello. At this point as long as she is improving we are moving in the right direction. She could have mono which we have not checked for that can have similar symptoms. Mono symptoms can be very severe but no treatment except supportive and steroids. I think starting oral steroids should help. Continue tylenol/ibuprofen for fever and abx. If not able to tolerate oral prednisone can come in for daily shots for a few days. If not continuing to improve see Dr. Ileene Rubens on Friday before weekend. I am going out of town but he has been aware of everything we are doing.

## 2013-03-03 NOTE — Telephone Encounter (Signed)
Pt left message this morning stating that you asked her to call back if she continued to run a fever.  She stated that she was ok for a day or 2 but last night she shot back up to 100.4 & this morning she was 99.

## 2013-03-03 NOTE — Telephone Encounter (Signed)
She states that she is feeling somewhat better.  She is able to eat boullion & jello but everything else feels like she "is swallowing razor blades".  Her temp was 101.4 last night. She is asking what else she could try to eat.  I suggested pudding & applesauce but she said she was afraid to eat pudding because of the milk in it & her fever.  She has not started the oral prednisone yet because it says to take with food & she is unable to swallow "real" food.  Would you like her to start it?

## 2013-03-03 NOTE — Telephone Encounter (Signed)
Pt.notified

## 2013-03-03 NOTE — Telephone Encounter (Signed)
If you are feeling better that is encouraging. Make sure pt is feeling better. Fever is consider 100.4 or higher. So ultimately you don't have a fever, which is good. I like that it is trending down. How is ST? Right lymphnodes? Have you been able to eat or drink.

## 2013-03-19 ENCOUNTER — Other Ambulatory Visit: Payer: Self-pay | Admitting: Physician Assistant

## 2013-03-19 ENCOUNTER — Telehealth: Payer: Self-pay

## 2013-03-19 ENCOUNTER — Telehealth: Payer: Self-pay | Admitting: *Deleted

## 2013-03-19 DIAGNOSIS — R5383 Other fatigue: Secondary | ICD-10-CM

## 2013-03-19 DIAGNOSIS — R110 Nausea: Secondary | ICD-10-CM

## 2013-03-19 DIAGNOSIS — R432 Parageusia: Secondary | ICD-10-CM

## 2013-03-19 DIAGNOSIS — J3489 Other specified disorders of nose and nasal sinuses: Secondary | ICD-10-CM

## 2013-03-19 NOTE — Telephone Encounter (Signed)
Pt notified & lab order is faxed downstairs.

## 2013-03-19 NOTE — Telephone Encounter (Signed)
CT Maxillofacial limited without does not require pre-auth.

## 2013-03-19 NOTE — Telephone Encounter (Signed)
Symptoms sound very consistent with mononucleosis. We should have checked since you weren't improving and now have lost the window. Let's get a CBC to make sure red blood count is normalizing. Fatigue up 4-6 weeks after infection is possible. We have treated with 3 rounds of abx. Let's get CT of sinuses to make sure you don't have a chronic sinusitis.

## 2013-03-19 NOTE — Telephone Encounter (Signed)
Pt left message that her fever finally broke but it's almost 3 weeks later & she still can not taste anything at all, the smell of food makes her nauseated so she doesn't have much of an appetite, and she feels extremely fatigued.

## 2013-03-20 LAB — CBC WITH DIFFERENTIAL/PLATELET
Basophils Absolute: 0 10*3/uL (ref 0.0–0.1)
Basophils Relative: 0 % (ref 0–1)
Eosinophils Absolute: 0.2 10*3/uL (ref 0.0–0.7)
Eosinophils Relative: 2 % (ref 0–5)
HEMATOCRIT: 38.2 % (ref 36.0–46.0)
HEMOGLOBIN: 12.9 g/dL (ref 12.0–15.0)
LYMPHS ABS: 3.4 10*3/uL (ref 0.7–4.0)
Lymphocytes Relative: 30 % (ref 12–46)
MCH: 29 pg (ref 26.0–34.0)
MCHC: 33.8 g/dL (ref 30.0–36.0)
MCV: 85.8 fL (ref 78.0–100.0)
MONO ABS: 0.9 10*3/uL (ref 0.1–1.0)
MONOS PCT: 8 % (ref 3–12)
NEUTROS ABS: 6.6 10*3/uL (ref 1.7–7.7)
Neutrophils Relative %: 60 % (ref 43–77)
Platelets: 351 10*3/uL (ref 150–400)
RBC: 4.45 MIL/uL (ref 3.87–5.11)
RDW: 15.5 % (ref 11.5–15.5)
WBC: 11.1 10*3/uL — ABNORMAL HIGH (ref 4.0–10.5)

## 2013-03-22 ENCOUNTER — Ambulatory Visit (INDEPENDENT_AMBULATORY_CARE_PROVIDER_SITE_OTHER): Payer: BC Managed Care – PPO

## 2013-03-22 DIAGNOSIS — R110 Nausea: Secondary | ICD-10-CM

## 2013-03-22 DIAGNOSIS — J3489 Other specified disorders of nose and nasal sinuses: Secondary | ICD-10-CM

## 2013-03-22 DIAGNOSIS — R439 Unspecified disturbances of smell and taste: Secondary | ICD-10-CM

## 2013-03-24 ENCOUNTER — Ambulatory Visit (INDEPENDENT_AMBULATORY_CARE_PROVIDER_SITE_OTHER): Payer: BC Managed Care – PPO | Admitting: Physician Assistant

## 2013-03-24 ENCOUNTER — Encounter: Payer: Self-pay | Admitting: Physician Assistant

## 2013-03-24 ENCOUNTER — Ambulatory Visit (INDEPENDENT_AMBULATORY_CARE_PROVIDER_SITE_OTHER): Payer: BC Managed Care – PPO

## 2013-03-24 VITALS — BP 114/68 | HR 84 | Temp 99.4°F | Ht 64.0 in | Wt 289.0 lb

## 2013-03-24 DIAGNOSIS — R05 Cough: Secondary | ICD-10-CM

## 2013-03-24 DIAGNOSIS — R058 Other specified cough: Secondary | ICD-10-CM

## 2013-03-24 DIAGNOSIS — R059 Cough, unspecified: Secondary | ICD-10-CM

## 2013-03-24 DIAGNOSIS — D72829 Elevated white blood cell count, unspecified: Secondary | ICD-10-CM

## 2013-03-24 MED ORDER — HYDROCOD POLST-CHLORPHEN POLST 10-8 MG/5ML PO LQCR: 5.0000 mL | Freq: Two times a day (BID) | ORAL | Status: DC | PRN

## 2013-03-24 MED ORDER — BENZONATATE 200 MG PO CAPS
200.0000 mg | ORAL_CAPSULE | Freq: Two times a day (BID) | ORAL | Status: DC | PRN
Start: 2013-03-24 — End: 2013-06-10

## 2013-03-24 MED ORDER — PREDNISONE 20 MG PO TABS: ORAL_TABLET | ORAL | Status: DC

## 2013-03-24 NOTE — Patient Instructions (Signed)

## 2013-03-25 LAB — CBC WITH DIFFERENTIAL/PLATELET
Basophils Absolute: 0 10*3/uL (ref 0.0–0.1)
Basophils Relative: 0 % (ref 0–1)
EOS PCT: 4 % (ref 0–5)
Eosinophils Absolute: 0.3 10*3/uL (ref 0.0–0.7)
HEMATOCRIT: 37 % (ref 36.0–46.0)
Hemoglobin: 12.4 g/dL (ref 12.0–15.0)
LYMPHS ABS: 2.8 10*3/uL (ref 0.7–4.0)
LYMPHS PCT: 30 % (ref 12–46)
MCH: 29.3 pg (ref 26.0–34.0)
MCHC: 33.5 g/dL (ref 30.0–36.0)
MCV: 87.5 fL (ref 78.0–100.0)
MONO ABS: 0.6 10*3/uL (ref 0.1–1.0)
MONOS PCT: 7 % (ref 3–12)
Neutro Abs: 5.7 10*3/uL (ref 1.7–7.7)
Neutrophils Relative %: 59 % (ref 43–77)
Platelets: 243 10*3/uL (ref 150–400)
RBC: 4.23 MIL/uL (ref 3.87–5.11)
RDW: 15.5 % (ref 11.5–15.5)
WBC: 9.5 10*3/uL (ref 4.0–10.5)

## 2013-03-26 NOTE — Progress Notes (Signed)
   Subjective:    Patient ID: Alicia White, female    DOB: Apr 29, 1960, 53 y.o.   MRN: [id number]  HPI Pt is a 53 yo female who presents to the clinic with continued sinus pressure and cough. Cough is her ongoing worse symptoms. She has not felt good since early January. Patient has had 2 rounds of antibiotics in 3 courses of steroid. Her fever has resolved however she has started running a low-grade fever today. Last CBC was elevated at 11.4. Her fatigue is better but she still complains of being very tired. Cough is not productive but more dry. She denies any chills, sore throat, ear pain. Denies any nausea or vomiting.   Review of Systems     Objective:   Physical Exam  Constitutional: She is oriented to person, place, and time. She appears well-developed and well-nourished.  HENT:  Head: Normocephalic and atraumatic.  Right Ear: External ear normal.  Left Ear: External ear normal.  Nose: Nose normal.  Mouth/Throat: Oropharynx is clear and moist.  Eyes: Conjunctivae are normal.  Neck: Normal range of motion. Neck supple.  I am still able to palpate some right sided lymphadenopathy. Not tender to touch.  Cardiovascular: Normal rate, regular rhythm and normal heart sounds.   Pulmonary/Chest: Effort normal and breath sounds normal.  Pulse ox is 97  Neurological: She is alert and oriented to person, place, and time.  Skin: Skin is dry.  Psychiatric: She has a normal mood and affect. Her behavior is normal.          Assessment & Plan:  Cough/sinusitis-CT of sinuses was done and was negative for any acute sinusitis or chronic sinusitis. Will get chest x-ray to evaluate for cough. I would like to do another round of steroid for 5 days. I did give her Tessalon Perles for daytime cough and Tussionex for nighttime cough. I feel like cough is most likely due to post-infectious in nature.   CXR stat was negative for any acute findings.  CBC repeat WBC was trending down and in normal  range.   I feel like pt had mono. I did not test for this. Pt is slowly getting better but I am not finding any other infectious causing to treat with abx.

## 2013-04-14 ENCOUNTER — Encounter: Payer: Self-pay | Admitting: Physician Assistant

## 2013-04-14 ENCOUNTER — Ambulatory Visit (INDEPENDENT_AMBULATORY_CARE_PROVIDER_SITE_OTHER): Payer: BC Managed Care – PPO | Admitting: Physician Assistant

## 2013-04-14 VITALS — BP 146/74 | HR 118 | Temp 100.1°F | Wt 280.0 lb

## 2013-04-14 DIAGNOSIS — R52 Pain, unspecified: Secondary | ICD-10-CM

## 2013-04-14 DIAGNOSIS — R509 Fever, unspecified: Secondary | ICD-10-CM

## 2013-04-14 DIAGNOSIS — J029 Acute pharyngitis, unspecified: Secondary | ICD-10-CM

## 2013-04-14 LAB — POCT INFLUENZA A/B
INFLUENZA B, POC: NEGATIVE
Influenza A, POC: NEGATIVE

## 2013-04-14 LAB — POCT RAPID STREP A (OFFICE): Rapid Strep A Screen: NEGATIVE

## 2013-04-14 MED ORDER — AMOXICILLIN-POT CLAVULANATE 875-125 MG PO TABS: 1.0000 | ORAL_TABLET | Freq: Two times a day (BID) | ORAL | Status: DC

## 2013-04-14 MED ORDER — PREDNISONE 20 MG PO TABS: ORAL_TABLET | ORAL | Status: DC

## 2013-04-14 NOTE — Patient Instructions (Addendum)
Start Zyrtec D daily.  Start prednisone pack.  Will give augmentin to use if not feeling better in next 1-2 days.  Drink water/fluids. Showing signs of dehydration.  If lymphnodes not going down in 4-6 weeks will do ultrasound.

## 2013-04-14 NOTE — Progress Notes (Addendum)
   Subjective:    Patient ID: Alicia White, female    DOB: 06-05-1960, 53 y.o.   MRN: [id number]  HPI Pt presents to the clinic with ST, generalized body aches, fever for 2 days. She was feeling great and symptoms hit her all of a sudden. Monday night temperature got as high as 102.6. She has had chills. Some nausea. NO ear pain or sinus pressure. ST this go around is 5/10 she was recently sick and her ST got as high as 20/10 on pain scale per pt. Her right neck feels like it is getting swollen again. No SOB, wheezing, coughing, difficultly swallowing. Has no desire to eat. Drinking some.      Review of Systems     Objective:   Physical Exam  Constitutional: She is oriented to person, place, and time. She appears well-developed and well-nourished.  HENT:  Head: Normocephalic and atraumatic.  Right Ear: External ear normal.  Left Ear: External ear normal.  Nose: Nose normal.  TM's clear bilaterally.   Oropharynx is erythematous, tonsils are bilaterally 1+ enlarged, no exudate.   Eyes: Conjunctivae are normal. Right eye exhibits no discharge. Left eye exhibits no discharge.  Neck: Normal range of motion. Neck supple.  Right sided tender lymphadenopathy.   Cardiovascular: Regular rhythm and normal heart sounds.   Tachycardia at 118.  Pulmonary/Chest: Effort normal and breath sounds normal.  Neurological: She is alert and oriented to person, place, and time.  Skin: Skin is warm.  Psychiatric: She has a normal mood and affect. Her behavior is normal.          Assessment & Plan:  ST/Fever/Body aches-  Peak flows in green zone. previously pt was sick and needed multiple abx to make better. Today influenza is negative, rapid Strep negative. Will test for mono and send throat culture off. lymphnodes have started to enlarge again on right side of neck. Symptoms certainly sound viral with acute onset.  Start Zyrtec D daily.  Start prednisone pack.  Will give augmentin to use if not  feeling better in next 1-2 days.  Drink water/fluids. Showing signs of dehydration.  If lymphnodes not going down in 4-6 weeks will do ultrasound.

## 2013-04-15 LAB — EPSTEIN-BARR VIRUS VCA ANTIBODY PANEL
EBV EA IgG: 128 U/mL — ABNORMAL HIGH (ref <9.0)
EBV NA IGG: 151 U/mL — AB (ref <18.0)
EBV VCA IgG: >750 U/mL — ABNORMAL HIGH (ref <18.0)
EBV VCA IgM: <10 U/mL (ref <36.0)

## 2013-04-15 LAB — CBC WITH DIFFERENTIAL/PLATELET
BASOS ABS: 0 10*3/uL (ref 0.0–0.1)
Basophils Relative: 0 % (ref 0–1)
Eosinophils Absolute: 0 10*3/uL (ref 0.0–0.7)
Eosinophils Relative: 0 % (ref 0–5)
HEMATOCRIT: 40.3 % (ref 36.0–46.0)
HEMOGLOBIN: 13.6 g/dL (ref 12.0–15.0)
LYMPHS PCT: 11 % — AB (ref 12–46)
Lymphs Abs: 1.9 10*3/uL (ref 0.7–4.0)
MCH: 29.5 pg (ref 26.0–34.0)
MCHC: 33.7 g/dL (ref 30.0–36.0)
MCV: 87.4 fL (ref 78.0–100.0)
MONO ABS: 1.2 10*3/uL — AB (ref 0.1–1.0)
MONOS PCT: 7 % (ref 3–12)
NEUTROS ABS: 13.9 10*3/uL — AB (ref 1.7–7.7)
Neutrophils Relative %: 82 % — ABNORMAL HIGH (ref 43–77)
Platelets: 260 10*3/uL (ref 150–400)
RBC: 4.61 MIL/uL (ref 3.87–5.11)
RDW: 15 % (ref 11.5–15.5)
WBC: 16.9 10*3/uL — ABNORMAL HIGH (ref 4.0–10.5)

## 2013-04-16 ENCOUNTER — Telehealth: Payer: Self-pay | Admitting: *Deleted

## 2013-04-16 LAB — CULTURE, GROUP A STREP

## 2013-04-16 NOTE — Telephone Encounter (Signed)
Pt wants to know if she should look further into lymphoma since her labs were high & her mother had it.  She states that she has these "raised" places on her gums & inside her cheeks.  She states that it's not painful but she finds herself chewing on it.  She wants to know if there is anything she could take for this.

## 2013-04-19 ENCOUNTER — Other Ambulatory Visit: Payer: Self-pay | Admitting: Physician Assistant

## 2013-04-19 DIAGNOSIS — R590 Localized enlarged lymph nodes: Secondary | ICD-10-CM

## 2013-04-19 MED ORDER — TRIAMCINOLONE ACETONIDE 0.1 % MT PSTE: 1.0000 "application " | PASTE | Freq: Two times a day (BID) | OROMUCOSAL | Status: DC

## 2013-04-19 NOTE — Telephone Encounter (Signed)
Sent to pharmacy cream to put on gums twice a day. Ordered ultrasound of neck to start evaluation for lymphoma.

## 2013-04-19 NOTE — Telephone Encounter (Signed)
All persons with IgG response to EBV have an increased risk for developing lymphoma at some point and those who have HIV can falsely represent IgG response to EBV. I do not think you have HIV if not IV drug user and one partner. There is a increased chance of lymphoma with family hx and EBV levels. Will will evaluate for lymphoma. At some point if you wish check for HIV but I do not think likely.

## 2013-04-19 NOTE — Telephone Encounter (Signed)
Pt is asking about the stuff at the bottom of the EBV results regarding HIV.  Lymphoma & burkitt's was also listed on there. Please advise

## 2013-04-20 ENCOUNTER — Ambulatory Visit (INDEPENDENT_AMBULATORY_CARE_PROVIDER_SITE_OTHER): Payer: BC Managed Care – PPO

## 2013-04-20 DIAGNOSIS — R229 Localized swelling, mass and lump, unspecified: Secondary | ICD-10-CM

## 2013-04-22 NOTE — Telephone Encounter (Signed)
Pt notified.  She is asking about her ultrasound results.  I see it in her chart but it's not in any baskets.

## 2013-04-23 ENCOUNTER — Telehealth: Payer: Self-pay | Admitting: *Deleted

## 2013-04-23 ENCOUNTER — Other Ambulatory Visit: Payer: Self-pay | Admitting: Physician Assistant

## 2013-04-23 DIAGNOSIS — R590 Localized enlarged lymph nodes: Secondary | ICD-10-CM

## 2013-04-23 DIAGNOSIS — R599 Enlarged lymph nodes, unspecified: Secondary | ICD-10-CM

## 2013-04-23 NOTE — Progress Notes (Signed)
Pt informed and is being scheduled for 1 week from now.  Oscar La, LPN

## 2013-04-23 NOTE — Telephone Encounter (Signed)
PA obtained for CT Neck w/contrast. AUth # is [id number]  Exp. 05/22/13.  Oscar La, LPN

## 2013-04-23 NOTE — Telephone Encounter (Signed)
See inbox sent results. Sent referral for biopsy as well make sure pt called with appt in next couple of days.

## 2013-06-10 ENCOUNTER — Ambulatory Visit (INDEPENDENT_AMBULATORY_CARE_PROVIDER_SITE_OTHER): Payer: Self-pay | Admitting: Sports Medicine

## 2013-06-10 ENCOUNTER — Encounter: Payer: Self-pay | Admitting: Sports Medicine

## 2013-06-10 VITALS — BP 119/68 | HR 78 | Ht 65.0 in | Wt 291.0 lb

## 2013-06-10 DIAGNOSIS — M25512 Pain in left shoulder: Secondary | ICD-10-CM | POA: Insufficient documentation

## 2013-06-10 DIAGNOSIS — M25519 Pain in unspecified shoulder: Secondary | ICD-10-CM

## 2013-06-10 NOTE — Assessment & Plan Note (Addendum)
Persistent pain over the deltoid bursa the right activities with positive impingement signs. Naproxen has not improved symptoms Ultrasound guided subacromial injection as above, formal physical therapy, return in 4 weeks.
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
## 2013-06-17 ENCOUNTER — Ambulatory Visit (INDEPENDENT_AMBULATORY_CARE_PROVIDER_SITE_OTHER): Payer: Self-pay

## 2013-06-17 DIAGNOSIS — M542 Cervicalgia: Secondary | ICD-10-CM

## 2013-06-17 DIAGNOSIS — M6281 Muscle weakness (generalized): Secondary | ICD-10-CM

## 2013-06-17 DIAGNOSIS — M25519 Pain in unspecified shoulder: Secondary | ICD-10-CM

## 2013-06-21 ENCOUNTER — Encounter: Payer: BC Managed Care – PPO | Admitting: Physical Therapy

## 2013-06-24 ENCOUNTER — Encounter (INDEPENDENT_AMBULATORY_CARE_PROVIDER_SITE_OTHER): Payer: Self-pay | Admitting: Physical Therapy

## 2013-06-24 DIAGNOSIS — M542 Cervicalgia: Secondary | ICD-10-CM

## 2013-06-24 DIAGNOSIS — M25519 Pain in unspecified shoulder: Secondary | ICD-10-CM

## 2013-06-24 DIAGNOSIS — M6281 Muscle weakness (generalized): Secondary | ICD-10-CM

## 2013-06-29 ENCOUNTER — Encounter: Payer: BC Managed Care – PPO | Admitting: Physical Therapy

## 2013-07-01 ENCOUNTER — Encounter: Payer: BC Managed Care – PPO | Admitting: Physical Therapy

## 2013-07-05 ENCOUNTER — Encounter (INDEPENDENT_AMBULATORY_CARE_PROVIDER_SITE_OTHER): Payer: Self-pay | Admitting: Physical Therapy

## 2013-07-05 DIAGNOSIS — M6281 Muscle weakness (generalized): Secondary | ICD-10-CM

## 2013-07-05 DIAGNOSIS — M542 Cervicalgia: Secondary | ICD-10-CM

## 2013-07-05 DIAGNOSIS — M25519 Pain in unspecified shoulder: Secondary | ICD-10-CM

## 2013-07-08 ENCOUNTER — Encounter (INDEPENDENT_AMBULATORY_CARE_PROVIDER_SITE_OTHER): Payer: BC Managed Care – PPO | Admitting: Physical Therapy

## 2013-07-08 DIAGNOSIS — M6281 Muscle weakness (generalized): Secondary | ICD-10-CM

## 2013-07-08 DIAGNOSIS — M25519 Pain in unspecified shoulder: Secondary | ICD-10-CM | POA: Diagnosis not present

## 2013-07-08 DIAGNOSIS — M542 Cervicalgia: Secondary | ICD-10-CM

## 2013-07-09 ENCOUNTER — Encounter: Payer: Self-pay | Admitting: Sports Medicine

## 2013-07-09 ENCOUNTER — Ambulatory Visit (INDEPENDENT_AMBULATORY_CARE_PROVIDER_SITE_OTHER): Payer: Self-pay | Admitting: Sports Medicine

## 2013-07-09 VITALS — BP 136/81 | HR 89 | Ht 65.0 in | Wt 289.0 lb

## 2013-07-09 DIAGNOSIS — M25519 Pain in unspecified shoulder: Secondary | ICD-10-CM

## 2013-07-09 DIAGNOSIS — M25512 Pain in left shoulder: Secondary | ICD-10-CM

## 2013-07-09 NOTE — Assessment & Plan Note (Signed)
Symptoms have not resolved after physical therapy and subacromial injection. She does have some pain over the biceps tendon but there are no clinical signs or symptoms of rupture of the long head. Return as needed, continue rehabilitation exercises.

## 2013-07-09 NOTE — Progress Notes (Signed)
  Subjective:    CC: Followup  HPI: I saw Alicia White last month with subacromial bursitis not responsive to conservative measures. She had a subacromial injection and formal physical therapy and returns pain-free. She is a bit of pain over the biceps tendon, but continues to improve.  Past medical history, Surgical history, Family history not pertinant except as noted below, Social history, Allergies, and medications have been entered into the medical record, reviewed, and no changes needed.   Review of Systems: No fevers, chills, night sweats, weight loss, chest pain, or shortness of breath.   Objective:    General: Well Developed, well nourished, and in no acute distress.  Neuro: Alert and oriented x3, extra-ocular muscles intact, sensation grossly intact.  HEENT: Normocephalic, atraumatic, pupils equal round reactive to light, neck supple, no masses, no lymphadenopathy, thyroid nonpalpable.  Skin: Warm and dry, no rashes. Cardiac: Regular rate and rhythm, no murmurs rubs or gallops, no lower extremity edema.  Respiratory: Clear to auscultation bilaterally. Not using accessory muscles, speaking in full sentences. Left Shoulder: Inspection reveals no abnormalities, atrophy or asymmetry. Palpation is normal with no tenderness over AC joint or bicipital groove. Only minimal tenderness to palpation over the biceps muscle belly with a negative Yergason test. ROM is full in all planes. Rotator cuff strength normal throughout. No signs of impingement with negative Neer and Hawkin's tests, empty can sign. Speeds and Yergason's tests normal. No labral pathology noted with negative Obrien's, negative clunk and good stability. Normal scapular function observed. No painful arc and no drop arm sign. No apprehension sign  Impression and Recommendations:

## 2013-07-23 ENCOUNTER — Encounter: Payer: Self-pay | Admitting: Physician Assistant

## 2013-07-23 ENCOUNTER — Ambulatory Visit (INDEPENDENT_AMBULATORY_CARE_PROVIDER_SITE_OTHER): Payer: BC Managed Care – PPO | Admitting: Physician Assistant

## 2013-07-23 VITALS — BP 122/69 | HR 87 | Ht 65.0 in | Wt 286.0 lb

## 2013-07-23 DIAGNOSIS — Z0000 Encounter for general adult medical examination without abnormal findings: Secondary | ICD-10-CM

## 2013-07-23 DIAGNOSIS — E785 Hyperlipidemia, unspecified: Secondary | ICD-10-CM

## 2013-07-23 DIAGNOSIS — E669 Obesity, unspecified: Secondary | ICD-10-CM

## 2013-07-23 DIAGNOSIS — Z1211 Encounter for screening for malignant neoplasm of colon: Secondary | ICD-10-CM

## 2013-07-23 DIAGNOSIS — Z131 Encounter for screening for diabetes mellitus: Secondary | ICD-10-CM

## 2013-07-23 NOTE — Patient Instructions (Signed)
Need to get need mammogram.  Will schedule colonoscopy.   Keeping You Healthy  Get These Tests  Blood Pressure- Have your blood pressure checked by your healthcare provider at least once a year.  Normal blood pressure is 120/80.  Weight- Have your body mass index (BMI) calculated to screen for obesity.  BMI is a measure of body fat based on height and weight.  You can calculate your own BMI at [employer].it  Cholesterol- Have your cholesterol checked every year.  Diabetes- Have your blood sugar checked every year if you have high blood pressure, high cholesterol, a family history of diabetes or if you are overweight.  Pap Smear- Have a pap smear every 1 to 3 years if you have been sexually active.  If you are older than 65 and recent pap smears have been normal you may not need additional pap smears.  In addition, if you have had a hysterectomy  For benign disease additional pap smears are not necessary.  Mammogram-Yearly mammograms are essential for early detection of breast cancer  Screening for Colon Cancer- Colonoscopy starting at age 29. Screening may begin sooner depending on your family history and other health conditions.  Follow up colonoscopy as directed by your Gastroenterologist.  Screening for Osteoporosis- Screening begins at age 52 with bone density scanning, sooner if you are at higher risk for developing Osteoporosis.  Get these medicines  Calcium with Vitamin D- Your body requires 1200-1500 mg of Calcium a day and [phone] IU of Vitamin D a day.  You can only absorb 500 mg of Calcium at a time therefore Calcium must be taken in 2 or 3 separate doses throughout the day.  Hormones- Hormone therapy has been associated with increased risk for certain cancers and heart disease.  Talk to your healthcare provider about if you need relief from menopausal symptoms.  Aspirin- Ask your healthcare provider about taking Aspirin to prevent Heart Disease and Stroke.  Get  these Immuniztions  Flu shot- Every fall  Pneumonia shot- Once after the age of 75; if you are younger ask your healthcare provider if you need a pneumonia shot.  Tetanus- Every ten years.  Zostavax- Once after the age of 33 to prevent shingles.  Take these steps  Don't smoke- Your healthcare provider can help you quit. For tips on how to quit, ask your healthcare provider or go to www.smokefree.gov or call 1-800 QUIT-NOW.  Be physically active- Exercise 5 days a week for a minimum of 30 minutes.  If you are not already physically active, start slow and gradually work up to 30 minutes of moderate physical activity.  Try walking, dancing, bike riding, swimming, etc.  Eat a healthy diet- Eat a variety of healthy foods such as fruits, vegetables, whole grains, low fat milk, low fat cheeses, yogurt, lean meats, chicken, fish, eggs, dried beans, tofu, etc.  For more information go to www.thenutritionsource.org  Dental visit- Brush and floss teeth twice daily; visit your dentist twice a year.  Eye exam- Visit your Optometrist or Ophthalmologist yearly.  Drink alcohol in moderation- Limit alcohol intake to one drink or less a day.  Never drink and drive.  Depression- Your emotional health is as important as your physical health.  If you're feeling down or losing interest in things you normally enjoy, please talk to your healthcare provider.  Seat Belts- can save your life; always wear one  Smoke/Carbon Monoxide detectors- These detectors need to be installed on the appropriate level of your home.  Replace batteries at least once a year.  Violence- If anyone is threatening or hurting you, please tell your healthcare provider.  Living Will/ Health care power of attorney- Discuss with your healthcare provider and family.

## 2013-07-26 NOTE — Progress Notes (Signed)
  Subjective:     Alicia White is a 53 y.o. female and is here for a comprehensive physical exam. The patient reports no problems.  History   Social History  . Marital Status: Single    Spouse Name: N/A    Number of Children: N/A  . Years of Education: N/A   Occupational History  . Not on file.   Social History Main Topics  . Smoking status: Never Smoker   . Smokeless tobacco: Never Used  . Alcohol Use: Yes     Comment: occassional  . Drug Use: No  . Sexual Activity: Yes   Other Topics Concern  . Not on file   Social History Narrative  . No narrative on file   Health Maintenance  Topic Date Due  . Colonoscopy  12/09/2012  . Mammogram  04/03/2013  . Influenza Vaccine  09/18/2013  . Pap Smear  04/02/2015  . Tetanus/tdap  03/24/2019    The following portions of the patient's history were reviewed and updated as appropriate: allergies, current medications, past family history, past medical history, past social history, past surgical history and problem list.  Review of Systems A comprehensive review of systems was negative.   Objective:    BP 122/69  Pulse 87  Ht 5\' 5"  (1.651 m)  Wt 286 lb (129.729 kg)  BMI 47.59 kg/m2  LMP 06/14/2012 General appearance: alert, cooperative and moderately obese Head: Normocephalic, without obvious abnormality, atraumatic Eyes: conjunctivae/corneas clear. PERRL, EOM's intact. Fundi benign. Ears: normal TM's and external ear canals both ears Nose: Nares normal. Septum midline. Mucosa normal. No drainage or sinus tenderness. Throat: lips, mucosa, and tongue normal; teeth and gums normal Neck: no adenopathy, no carotid bruit, no JVD, supple, symmetrical, trachea midline and thyroid not enlarged, symmetric, no tenderness/mass/nodules Back: symmetric, no curvature. ROM normal. No CVA tenderness. Lungs: clear to auscultation bilaterally Heart: regular rate and rhythm, S1, S2 normal, no murmur, click, rub or gallop Abdomen: soft,  non-tender; bowel sounds normal; no masses,  no organomegaly Extremities: extremities normal, atraumatic, no cyanosis or edema Pulses: 2+ and symmetric Skin: Skin color, texture, turgor normal. No rashes or lesions Lymph nodes: Cervical, supraclavicular, and axillary nodes normal. Neurologic: Grossly normal    Assessment:    Healthy female exam.      Plan:    CPE/obesity- Pap smear today. Needs to schedule mammogram for this year. Will schedule colonoscopy. Fasting labs were ordered. Encouraged to continue taking calcium and vitamin D daily. Will recheck vitamin D level today. Encouraged 1200-calorie diet with exercise 4-5 times a week for at least 30 minutes. Followup if the bike to discuss weight loss medications. Vaccines are up-to-date except for her pneumococcal vaccine. Patient does have history of frequent bronchitis exacerbations. Feel like a pneumococcal vaccine would be beneficial. Patient declined today. Will followup in the fall and consider.   Hyperlipidemia-fasting labs ordered today. See After Visit Summary for Counseling Recommendations

## 2013-07-28 ENCOUNTER — Other Ambulatory Visit: Payer: Self-pay | Admitting: Physician Assistant

## 2013-07-28 DIAGNOSIS — Z1231 Encounter for screening mammogram for malignant neoplasm of breast: Secondary | ICD-10-CM

## 2013-08-03 ENCOUNTER — Ambulatory Visit (INDEPENDENT_AMBULATORY_CARE_PROVIDER_SITE_OTHER): Payer: BC Managed Care – PPO

## 2013-08-03 DIAGNOSIS — Z1231 Encounter for screening mammogram for malignant neoplasm of breast: Secondary | ICD-10-CM

## 2013-08-07 LAB — LIPID PANEL
CHOL/HDL RATIO: 3.4 ratio
CHOLESTEROL: 190 mg/dL (ref 0–200)
HDL: 56 mg/dL (ref >39)
LDL CALC: 122 mg/dL — AB (ref 0–99)
Triglycerides: 62 mg/dL (ref <150)
VLDL: 12 mg/dL (ref 0–40)

## 2013-08-07 LAB — COMPLETE METABOLIC PANEL WITH GFR
ALBUMIN: 3.8 g/dL (ref 3.5–5.2)
ALK PHOS: 48 U/L (ref 39–117)
ALT: 37 U/L — AB (ref 0–35)
AST: 23 U/L (ref 0–37)
BUN: 10 mg/dL (ref 6–23)
CHLORIDE: 105 meq/L (ref 96–112)
CO2: 28 mEq/L (ref 19–32)
Calcium: 9.1 mg/dL (ref 8.4–10.5)
Creat: 0.58 mg/dL (ref 0.50–1.10)
GFR, Est African American: >89 mL/min
GFR, Est Non African American: >89 mL/min
Glucose, Bld: 84 mg/dL (ref 70–99)
POTASSIUM: 4.2 meq/L (ref 3.5–5.3)
Sodium: 141 mEq/L (ref 135–145)
Total Bilirubin: 0.4 mg/dL (ref 0.2–1.2)
Total Protein: 6.5 g/dL (ref 6.0–8.3)

## 2013-08-07 LAB — VITAMIN B12: Vitamin B-12: 675 pg/mL (ref 211–911)

## 2013-08-07 LAB — VITAMIN D 25 HYDROXY (VIT D DEFICIENCY, FRACTURES): Vit D, 25-Hydroxy: 45 ng/mL (ref 30–89)

## 2013-08-30 ENCOUNTER — Telehealth: Payer: Self-pay | Admitting: *Deleted

## 2013-08-30 DIAGNOSIS — R740 Nonspecific elevation of levels of transaminase and lactic acid dehydrogenase [LDH]: Principal | ICD-10-CM

## 2013-08-30 DIAGNOSIS — R7401 Elevation of levels of liver transaminase levels: Secondary | ICD-10-CM

## 2013-08-30 NOTE — Telephone Encounter (Signed)
CMP ordered to recheck elevated liver enzymes.

## 2013-09-02 ENCOUNTER — Ambulatory Visit (INDEPENDENT_AMBULATORY_CARE_PROVIDER_SITE_OTHER): Payer: BC Managed Care – PPO

## 2013-09-02 ENCOUNTER — Ambulatory Visit (INDEPENDENT_AMBULATORY_CARE_PROVIDER_SITE_OTHER): Payer: BC Managed Care – PPO | Admitting: Sports Medicine

## 2013-09-02 ENCOUNTER — Encounter: Payer: Self-pay | Admitting: Sports Medicine

## 2013-09-02 VITALS — BP 144/90 | HR 104 | Wt 295.0 lb

## 2013-09-02 DIAGNOSIS — M170 Bilateral primary osteoarthritis of knee: Secondary | ICD-10-CM

## 2013-09-02 DIAGNOSIS — M1710 Unilateral primary osteoarthritis, unspecified knee: Secondary | ICD-10-CM

## 2013-09-02 MED ORDER — NAPROXEN-ESOMEPRAZOLE 500-20 MG PO TBEC: 1.0000 | DELAYED_RELEASE_TABLET | Freq: Two times a day (BID) | ORAL | Status: DC

## 2013-09-02 NOTE — Progress Notes (Signed)
   Subjective:    I'm seeing this patient as a consultation for:  Iran Planas, PA-C  CC: Bilateral knee pain  HPI: Pain is moderate, persistent, localized to the medial joint line of both knees, stiff in the mornings, worse with weightbearing without radiation, swelling, or mechanical symptoms. Has not tried any NSAIDs yet.  Past medical history, Surgical history, Family history not pertinant except as noted below, Social history, Allergies, and medications have been entered into the medical record, reviewed, and no changes needed.   Review of Systems: No headache, visual changes, nausea, vomiting, diarrhea, constipation, dizziness, abdominal pain, skin rash, fevers, chills, night sweats, weight loss, swollen lymph nodes, body aches, joint swelling, muscle aches, chest pain, shortness of breath, mood changes, visual or auditory hallucinations.   Objective:   General: Well Developed, well nourished, and in no acute distress.  Neuro/Psych: Alert and oriented x3, extra-ocular muscles intact, able to move all 4 extremities, sensation grossly intact. Skin: Warm and dry, no rashes noted.  Respiratory: Not using accessory muscles, speaking in full sentences, trachea midline.  Cardiovascular: Pulses palpable, no extremity edema. Abdomen: Does not appear distended. Bilateral Knee: Normal to inspection with no erythema or effusion or obvious bony abnormalities. Tender to palpation in both joint lines. ROM normal in flexion and extension and lower leg rotation. Ligaments with solid consistent endpoints including ACL, PCL, LCL, MCL. Negative Mcmurray's and provocative meniscal tests. Non painful patellar compression. Patellar and quadriceps tendons unremarkable. Hamstring and quadriceps strength is normal.   X-rays reviewed show tricompartmental osteoarthritis bilaterally.  Impression and Recommendations:   This case required medical decision making of moderate complexity.

## 2013-09-02 NOTE — Assessment & Plan Note (Signed)
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 X-rays, naproxen/esomeprazole, formal physical therapy. Return in a month, injection if no better.

## 2013-09-03 ENCOUNTER — Telehealth: Payer: Self-pay

## 2013-09-03 DIAGNOSIS — M170 Bilateral primary osteoarthritis of knee: Secondary | ICD-10-CM

## 2013-09-03 LAB — COMPLETE METABOLIC PANEL WITH GFR
ALBUMIN: 3.9 g/dL (ref 3.5–5.2)
ALT: 29 U/L (ref 0–35)
AST: 17 U/L (ref 0–37)
Alkaline Phosphatase: 56 U/L (ref 39–117)
BUN: 8 mg/dL (ref 6–23)
CALCIUM: 9.1 mg/dL (ref 8.4–10.5)
CHLORIDE: 102 meq/L (ref 96–112)
CO2: 28 meq/L (ref 19–32)
Creat: 0.62 mg/dL (ref 0.50–1.10)
GFR, Est Non African American: >89 mL/min
Glucose, Bld: 75 mg/dL (ref 70–99)
POTASSIUM: 4.1 meq/L (ref 3.5–5.3)
Sodium: 141 mEq/L (ref 135–145)
Total Bilirubin: 0.6 mg/dL (ref 0.2–1.2)
Total Protein: 7 g/dL (ref 6.0–8.3)

## 2013-09-03 MED ORDER — NAPROXEN-ESOMEPRAZOLE 500-20 MG PO TBEC: 1.0000 | DELAYED_RELEASE_TABLET | Freq: Two times a day (BID) | ORAL | Status: DC

## 2013-09-03 NOTE — Telephone Encounter (Signed)
Rx was sent to wrong pharmacy. I resent to [place]. Kymari Lollis,CMA

## 2013-09-08 ENCOUNTER — Ambulatory Visit (INDEPENDENT_AMBULATORY_CARE_PROVIDER_SITE_OTHER): Payer: BC Managed Care – PPO

## 2013-09-08 DIAGNOSIS — M1710 Unilateral primary osteoarthritis, unspecified knee: Secondary | ICD-10-CM

## 2013-09-08 DIAGNOSIS — R262 Difficulty in walking, not elsewhere classified: Secondary | ICD-10-CM

## 2013-09-08 DIAGNOSIS — R291 Meningismus: Secondary | ICD-10-CM

## 2013-09-08 DIAGNOSIS — M25569 Pain in unspecified knee: Secondary | ICD-10-CM

## 2013-09-13 ENCOUNTER — Encounter (INDEPENDENT_AMBULATORY_CARE_PROVIDER_SITE_OTHER): Payer: BC Managed Care – PPO | Admitting: Physical Therapy

## 2013-09-13 DIAGNOSIS — M1710 Unilateral primary osteoarthritis, unspecified knee: Secondary | ICD-10-CM

## 2013-09-13 DIAGNOSIS — R269 Unspecified abnormalities of gait and mobility: Secondary | ICD-10-CM

## 2013-09-13 DIAGNOSIS — M25569 Pain in unspecified knee: Secondary | ICD-10-CM

## 2013-09-15 ENCOUNTER — Encounter: Payer: BC Managed Care – PPO | Admitting: Physical Therapy

## 2013-09-30 ENCOUNTER — Ambulatory Visit: Payer: BC Managed Care – PPO | Admitting: Sports Medicine

## 2013-10-06 ENCOUNTER — Telehealth: Payer: Self-pay

## 2013-10-06 ENCOUNTER — Other Ambulatory Visit: Payer: Self-pay | Admitting: Physician Assistant

## 2013-10-06 DIAGNOSIS — L918 Other hypertrophic disorders of the skin: Secondary | ICD-10-CM

## 2013-10-06 NOTE — Telephone Encounter (Signed)
Sent referral 

## 2013-10-06 NOTE — Telephone Encounter (Signed)
Patient would like to have skin tags removed from her face.

## 2014-01-24 ENCOUNTER — Other Ambulatory Visit: Payer: Self-pay | Admitting: Physician Assistant

## 2014-05-18 ENCOUNTER — Ambulatory Visit (INDEPENDENT_AMBULATORY_CARE_PROVIDER_SITE_OTHER): Payer: BC Managed Care – PPO | Admitting: Physician Assistant

## 2014-05-18 ENCOUNTER — Encounter: Payer: Self-pay | Admitting: Physician Assistant

## 2014-05-18 VITALS — BP 145/90 | HR 87 | Wt 299.0 lb

## 2014-05-18 DIAGNOSIS — J039 Acute tonsillitis, unspecified: Secondary | ICD-10-CM | POA: Diagnosis not present

## 2014-05-18 DIAGNOSIS — J029 Acute pharyngitis, unspecified: Secondary | ICD-10-CM

## 2014-05-18 LAB — POCT RAPID STREP A (OFFICE): Rapid Strep A Screen: NEGATIVE

## 2014-05-18 MED ORDER — AMOXICILLIN-POT CLAVULANATE 875-125 MG PO TABS: 1.0000 | ORAL_TABLET | Freq: Two times a day (BID) | ORAL | Status: DC

## 2014-05-18 MED ORDER — PREDNISONE 20 MG PO TABS: ORAL_TABLET | ORAL | Status: DC

## 2014-05-18 NOTE — Patient Instructions (Signed)

## 2014-05-18 NOTE — Progress Notes (Signed)
   Subjective:    Patient ID: Alicia White, female    DOB: [birth year], 54 y.o.   MRN: [id number]  HPI  Pt is a 54 yo female who presents to the clinic with sudden sore throat that started Monday, approximately 3 days ago. ST continues to worsen. Low grade fever Monday. Hx of strep at least once a year. She did start left over amoxicillin yesterday and seem to help. No cough, sinus pressure, wheezing or SOB. She did do a salt water gargle last night which helped a little.    Review of Systems  All other systems reviewed and are negative.      Objective:   Physical Exam  Constitutional: She is oriented to person, place, and time. She appears well-developed and well-nourished.  HENT:  Head: Normocephalic and atraumatic.  Right Ear: External ear normal.  Left Ear: External ear normal.  TM's are a little erythematous bilaterally with no blood or pus.   Negative maxillary or frontal sinus tenderness to palpation.   Oropharynx erythematous with bilateral tonsilar swelling 2+ hypertrophy with white exudate.   Eyes: Conjunctivae are normal. Right eye exhibits no discharge. Left eye exhibits no discharge.  Neck: Normal range of motion. Neck supple.  Bilateral tender adenopathy anterior cervical nodes.   Cardiovascular: Normal rate, regular rhythm and normal heart sounds.   Pulmonary/Chest: Effort normal and breath sounds normal. She has no wheezes.  Neurological: She is alert and oriented to person, place, and time.  Skin: Skin is dry.  Psychiatric: She has a normal mood and affect. Her behavior is normal.          Assessment & Plan:  Acute pharyngitis/acute tonsillitis- rapid strep negative. Hx of in office strep being negative but culture positive. Will treat with augmentin for 10 days and prednisone taper. Encouraged salt water gargles and ibuprofen. Call if not improving. Written out of work for next 2 days.

## 2014-05-24 ENCOUNTER — Ambulatory Visit (INDEPENDENT_AMBULATORY_CARE_PROVIDER_SITE_OTHER): Payer: BC Managed Care – PPO | Admitting: Family Medicine

## 2014-05-24 ENCOUNTER — Ambulatory Visit (INDEPENDENT_AMBULATORY_CARE_PROVIDER_SITE_OTHER): Payer: BC Managed Care – PPO | Admitting: Sports Medicine

## 2014-05-24 ENCOUNTER — Encounter: Payer: Self-pay | Admitting: Family Medicine

## 2014-05-24 ENCOUNTER — Other Ambulatory Visit: Payer: Self-pay | Admitting: Physician Assistant

## 2014-05-24 VITALS — BP 140/82 | Temp 100.0°F | Wt 296.0 lb

## 2014-05-24 DIAGNOSIS — E860 Dehydration: Secondary | ICD-10-CM | POA: Diagnosis not present

## 2014-05-24 DIAGNOSIS — R1110 Vomiting, unspecified: Secondary | ICD-10-CM

## 2014-05-24 DIAGNOSIS — R197 Diarrhea, unspecified: Secondary | ICD-10-CM

## 2014-05-24 MED ORDER — ONDANSETRON HCL 4 MG PO TABS: 4.0000 mg | ORAL_TABLET | Freq: Three times a day (TID) | ORAL | Status: DC | PRN

## 2014-05-24 MED ORDER — ONDANSETRON HCL 8 MG PO TABS: 8.0000 mg | ORAL_TABLET | Freq: Three times a day (TID) | ORAL | Status: DC | PRN

## 2014-05-24 MED ORDER — ONDANSETRON HCL 4 MG PO TABS
8.0000 mg | ORAL_TABLET | Freq: Once | ORAL | Status: AC
  Administered 2014-05-24: 8 mg via ORAL

## 2014-05-24 NOTE — Progress Notes (Signed)
CC: Alicia White is a 54 y.o. female is here for Diarrhea   Subjective: HPI:  Awoke this morning at her normal time with a sudden urge to defecate. She spent what sounds about half an hour on the toilet having waves of liquid diarrhea. She's had some mild bloating in the lower abdomen but overall no pain. She's had decreased appetite and a mild sensation of nausea. She's been using Pepto-Bismol but she does not feel like it helped. She continues to get moderate to severe waves of sudden urges to defecate and has almost had a few accidents. She tells me that she ate at a local fast food restaurant last night and the food didn't taste right but there's been no other changes to her daily routine for consumption of known undercooked food. She denies fevers, chills nor any other gastrointestinal or genitourinary complaints. Review of systems positive for dizziness. Denies shortness of breath chest pain or irregular heartbeat.   Review Of Systems Outlined In HPI  Past Medical History  Diagnosis Date  . Heart murmur   . Asthma   . Pneumonia   . Fibroids   . Ovarian cyst   . Endometrial carcinoma     Past Surgical History  Procedure Laterality Date  . Dental surgery    . Abdominal hysterectomy  07/01/12   Family History  Problem Relation Age of Onset  . Cancer Mother     lymphoma  . Hyperlipidemia Father   . Stroke Father   . Heart attack Maternal Grandmother   . Heart attack Maternal Grandfather   . Heart attack Paternal Grandfather   . Stroke Paternal Grandmother   . High blood pressure Father   . Lymphoma Mother     History   Social History  . Marital Status: Single    Spouse Name: N/A  . Number of Children: N/A  . Years of Education: N/A   Occupational History  . Not on file.   Social History Main Topics  . Smoking status: Never Smoker   . Smokeless tobacco: Never Used  . Alcohol Use: Yes     Comment: occassional  . Drug Use: No  . Sexual Activity: Yes   Other  Topics Concern  . Not on file   Social History Narrative     Objective: BP 140/82 mmHg  Temp(Src) 100 F (37.8 C) (Oral)  Wt 296 lb (134.265 kg)  SpO2 99%  LMP 06/14/2012  General: Alert and Oriented, No Acute Distress HEENT: Pupils equal, round, reactive to light. Conjunctivae clear.  Moist mucous membranes Lungs: Clear to auscultation bilaterally, no wheezing/ronchi/rales.  Comfortable work of breathing. Good air movement. Cardiac: Regular rate and rhythm. Normal S1/S2.  No murmurs, rubs, nor gallops.   Abdomen: Normal bowel sounds, soft and non tender without palpable masses. No rebound or guarding Extremities: No peripheral edema.  Strong peripheral pulses.  Mental Status: No depression, anxiety, nor agitation. Skin: Warm and dry.  Assessment & Plan: Alicia White was seen today for diarrhea.  Diagnoses and all orders for this visit:  Vomiting and diarrhea Orders: -     Discontinue: ondansetron (ZOFRAN) 8 MG tablet; Take 1 tablet (8 mg total) by mouth every 8 (eight) hours as needed for nausea or vomiting. -     ondansetron (ZOFRAN) tablet 8 mg; Take 2 tablets (8 mg total) by mouth once. -     ondansetron (ZOFRAN) 4 MG tablet; Take 1-2 tablets (4-8 mg total) by mouth every 8 (eight) hours as needed for  nausea or vomiting.   While examining her abdomen she had sudden onset of nausea and vomiting of Pepto-Bismol.  She received 8mg  of zofran after this and was set up for 1L of NS.    After receiving 1 L she tells me that she's feeling a little better with no nausea but still has dry mouth and mild dizziness therefore receive another fluid today.   After a additional liter dizziness has resolved and she tells me she is feeling better. No nausea or fecal urgency. She's Aleve yesterday with upper prescription for Zofran I've asked her to schedule 8 mg every 8 hours for the next 24 hours then on an as-needed basis. Small frequent sips of sports drink or Gatorade to stay hydrated.  Anticipate all symptoms should pass within the next 24-48 hours and at this is most likely due to viral gastroenteritis.Signs and symptoms requring emergent/urgent reevaluation were discussed with the patient.  40 minutes spent face-to-face during visit today of which at least 50% was counseling or coordinating care regarding: 1. Vomiting and diarrhea      Return if symptoms worsen or fail to improve.

## 2014-05-24 NOTE — Progress Notes (Signed)
   Subjective:    I'm seeing this patient as a consultation for:  Dr. Ileene Rubens  CC: Dehydration  HPI: This is a pleasant 54 year old female with persistent nausea and vomiting who has developed some dehydration and inability to keep down fluids orally. I was called for assistance with parenteral hydration. Symptoms are moderate, persistent, further evaluation of symptoms will be per PCP.  Past medical history, Surgical history, Family history not pertinant except as noted below, Social history, Allergies, and medications have been entered into the medical record, reviewed, and no changes needed.   Review of Systems: No headache, visual changes, nausea, vomiting, diarrhea, constipation, dizziness, abdominal pain, skin rash, fevers, chills, night sweats, weight loss, swollen lymph nodes, body aches, joint swelling, muscle aches, chest pain, shortness of breath, mood changes, visual or auditory hallucinations.   Objective:   General: Well Developed, well nourished, and in no acute distress.  Neuro/Psych: Alert and oriented x3, extra-ocular muscles intact, able to move all 4 extremities, sensation grossly intact. Skin: Warm and dry, no rashes noted.  Respiratory: Not using accessory muscles, speaking in full sentences, trachea midline.  Cardiovascular: Pulses palpable, no extremity edema. Abdomen: Does not appear distended.  A 20-gauge Angiocath was placed into the dorsal venous plexus of the right hand, 2 L of normal saline were run without a problem. After fluids were run, IV was removed without complication.  Impression and Recommendations:   This case required medical decision making of moderate complexity.

## 2014-05-24 NOTE — Assessment & Plan Note (Signed)
IV fluids as above.

## 2014-05-26 ENCOUNTER — Telehealth: Payer: Self-pay | Admitting: *Deleted

## 2014-05-26 ENCOUNTER — Institutional Professional Consult (permissible substitution): Payer: BC Managed Care – PPO | Admitting: Sports Medicine

## 2014-05-26 NOTE — Telephone Encounter (Signed)
Pt called and states she has started having diarrhea again since 3 am. She wants something to stop the diarrhea. Its not has bad as it was initially

## 2014-05-27 MED ORDER — DIPHENOXYLATE-ATROPINE 2.5-0.025 MG PO TABS: 1.0000 | ORAL_TABLET | Freq: Four times a day (QID) | ORAL | Status: DC | PRN

## 2014-05-27 NOTE — Telephone Encounter (Signed)
Pt.notified

## 2014-05-27 NOTE — Telephone Encounter (Signed)
Andrea, Rx placed in in-box ready for pickup/faxing.  

## 2015-05-22 ENCOUNTER — Telehealth: Payer: Self-pay | Admitting: *Deleted

## 2015-05-22 DIAGNOSIS — Z0000 Encounter for general adult medical examination without abnormal findings: Secondary | ICD-10-CM

## 2015-05-22 DIAGNOSIS — Z13220 Encounter for screening for lipoid disorders: Secondary | ICD-10-CM

## 2015-05-22 NOTE — Telephone Encounter (Signed)
Labs ordered.

## 2015-05-30 ENCOUNTER — Ambulatory Visit (INDEPENDENT_AMBULATORY_CARE_PROVIDER_SITE_OTHER): Payer: BC Managed Care – PPO | Admitting: Physician Assistant

## 2015-05-30 ENCOUNTER — Encounter: Payer: Self-pay | Admitting: Physician Assistant

## 2015-05-30 VITALS — BP 116/70 | HR 72 | Ht 65.0 in | Wt 304.0 lb

## 2015-05-30 DIAGNOSIS — M25561 Pain in right knee: Secondary | ICD-10-CM

## 2015-05-30 DIAGNOSIS — Z1159 Encounter for screening for other viral diseases: Secondary | ICD-10-CM

## 2015-05-30 DIAGNOSIS — G8929 Other chronic pain: Secondary | ICD-10-CM | POA: Insufficient documentation

## 2015-05-30 DIAGNOSIS — R05 Cough: Secondary | ICD-10-CM | POA: Diagnosis not present

## 2015-05-30 DIAGNOSIS — L570 Actinic keratosis: Secondary | ICD-10-CM

## 2015-05-30 DIAGNOSIS — Z1231 Encounter for screening mammogram for malignant neoplasm of breast: Secondary | ICD-10-CM | POA: Diagnosis not present

## 2015-05-30 DIAGNOSIS — Z0000 Encounter for general adult medical examination without abnormal findings: Secondary | ICD-10-CM | POA: Diagnosis not present

## 2015-05-30 DIAGNOSIS — R059 Cough, unspecified: Secondary | ICD-10-CM

## 2015-05-30 DIAGNOSIS — Z131 Encounter for screening for diabetes mellitus: Secondary | ICD-10-CM

## 2015-05-30 DIAGNOSIS — E669 Obesity, unspecified: Secondary | ICD-10-CM

## 2015-05-30 DIAGNOSIS — E559 Vitamin D deficiency, unspecified: Secondary | ICD-10-CM

## 2015-05-30 DIAGNOSIS — M25562 Pain in left knee: Secondary | ICD-10-CM

## 2015-05-30 DIAGNOSIS — Z114 Encounter for screening for human immunodeficiency virus [HIV]: Secondary | ICD-10-CM

## 2015-05-30 DIAGNOSIS — Z13220 Encounter for screening for lipoid disorders: Secondary | ICD-10-CM

## 2015-05-30 MED ORDER — FLUOROURACIL 5 % EX CREA: TOPICAL_CREAM | Freq: Two times a day (BID) | CUTANEOUS | Status: DC

## 2015-05-30 MED ORDER — PREDNISONE 20 MG PO TABS: ORAL_TABLET | ORAL | Status: DC

## 2015-05-30 MED ORDER — SPIRONOLACTONE 50 MG PO TABS: ORAL_TABLET | ORAL | Status: DC

## 2015-05-30 MED ORDER — MELOXICAM 15 MG PO TABS: 15.0000 mg | ORAL_TABLET | Freq: Every day | ORAL | Status: DC

## 2015-05-30 MED ORDER — NALTREXONE-BUPROPION HCL ER 8-90 MG PO TB12: ORAL_TABLET | ORAL | Status: DC

## 2015-05-30 NOTE — Patient Instructions (Signed)
Actinic Keratosis Actinic keratosis is a precancerous growth on the skin. This means it could develop into skin cancer if it is not treated. About 1% of actinic keratoses turn into skin cancer within a year. It is important to have all such growths removed to prevent them from developing into skin cancer. CAUSES  Actinic keratosis is caused by getting too much ultraviolet (UV) radiation from the sun or other UV light sources. RISK FACTORS Factors that increase your chances of getting actinic keratosis include:  Having light-colored skin and blue eyes.  Having blonde or red hair.  Spending a lot of time in the sun.  Age. The risk of actinic keratosis increases with age. SYMPTOMS  Actinic keratosis growths look like scaly, rough spots of skin. They can be as small as a pinhead or as big as a quarter. They may itch, hurt, or feel sensitive. Sometimes there is a little tag of pink or gray skin growing off them. In some cases, actinic keratoses are easier felt than seen. They do not go away with the use of moisturizing lotions or creams. Actinic keratoses appear most often on areas of skin that get a lot of sun exposure. These areas include the:  Scalp.  Face.  Ears.  Lips.  Upper back.  Backs of the hands.  Forearms. DIAGNOSIS  Your health care provider can usually tell what is wrong by performing a physical exam. A tissue sample (biopsy) may also be taken and examined under a microscope. TREATMENT  Actinic keratosis can be treated several ways. Most treatments can be done in your health care provider's office. Treatment options may include:  Curettage. A tool is used to gently scrape off the growth.  Cryosurgery. Liquid nitrogen is applied to the growth to freeze it. The growth eventually falls off the skin.  Medicated creams, such as 5-fluorouracil or imiquimod. The medicine destroys the cells in the growth.  Chemical peels. Chemicals are applied to the growth and the outer  layers of skin are peeled off.  Photodynamic therapy. A drug that makes your skin more sensitive to light is applied to the skin. A strong, blue light is aimed at the skin and destroys the growth. PREVENTION  To prevent future sun damage:  Try to avoid the sun between 10:00 a.m. and 4:00 p.m. when it is the strongest.  Use a sunscreen or sunblock with SPF 30 or greater.  Apply sunscreen at least 30 minutes before exposure to the sun.  Always wear protective hats, clothing, and sunglasses with UV protection.  Avoid medicines, herbs, and foods that increase your sensitivity to sunlight.  Avoid tanning beds. HOME CARE INSTRUCTIONS   If your skin was covered with a bandage, change and remove the bandage as directed by your health care provider.  Keep the treated area dry as directed by your health care provider.  Apply any creams as prescribed by your health care provider. Follow the directions carefully.  Check your skin regularly for any changes.  Visit a skin doctor (dermatologist) every year for a skin exam. SEEK MEDICAL CARE IF:   Your skin does not heal and becomes irritated, red, or bleeds.  You notice any changes or new growths on your skin.   This information is not intended to replace advice given to you by your health care provider. Make sure you discuss any questions you have with your health care provider.   Document Released: 05/03/2008 Document Revised: 02/25/2014 Document Reviewed: 03/18/2011 Elsevier Interactive Patient Education 2016 Elsevier   Inc. Keeping You Healthy  Get These Tests  Blood Pressure- Have your blood pressure checked by your healthcare provider at least once a year.  Normal blood pressure is 120/80.  Weight- Have your body mass index (BMI) calculated to screen for obesity.  BMI is a measure of body fat based on height and weight.  You can calculate your own BMI at [employer].it  Cholesterol- Have your cholesterol checked every  year.  Diabetes- Have your blood sugar checked every year if you have high blood pressure, high cholesterol, a family history of diabetes or if you are overweight.  Pap Test - Have a pap test every 1 to 5 years if you have been sexually active.  If you are older than 65 and recent pap tests have been normal you may not need additional pap tests.  In addition, if you have had a hysterectomy  for benign disease additional pap tests are not necessary.  Mammogram-Yearly mammograms are essential for early detection of breast cancer  Screening for Colon Cancer- Colonoscopy starting at age 77. Screening may begin sooner depending on your family history and other health conditions.  Follow up colonoscopy as directed by your Gastroenterologist.  Screening for Osteoporosis- Screening begins at age 71 with bone density scanning, sooner if you are at higher risk for developing Osteoporosis.  Get these medicines  Calcium with Vitamin D- Your body requires 1200-1500 mg of Calcium a day and [phone] IU of Vitamin D a day.  You can only absorb 500 mg of Calcium at a time therefore Calcium must be taken in 2 or 3 separate doses throughout the day.  Hormones- Hormone therapy has been associated with increased risk for certain cancers and heart disease.  Talk to your healthcare provider about if you need relief from menopausal symptoms.  Aspirin- Ask your healthcare provider about taking Aspirin to prevent Heart Disease and Stroke.  Get these Immuniztions  Flu shot- Every fall  Pneumonia shot- Once after the age of 31; if you are younger ask your healthcare provider if you need a pneumonia shot.  Tetanus- Every ten years.  Zostavax- Once after the age of 52 to prevent shingles.  Take these steps  Don't smoke- Your healthcare provider can help you quit. For tips on how to quit, ask your healthcare provider or go to www.smokefree.gov or call 1-800 QUIT-NOW.  Be physically active- Exercise 5 days a week  for a minimum of 30 minutes.  If you are not already physically active, start slow and gradually work up to 30 minutes of moderate physical activity.  Try walking, dancing, bike riding, swimming, etc.  Eat a healthy diet- Eat a variety of healthy foods such as fruits, vegetables, whole grains, low fat milk, low fat cheeses, yogurt, lean meats, chicken, fish, eggs, dried beans, tofu, etc.  For more information go to www.thenutritionsource.org  Dental visit- Brush and floss teeth twice daily; visit your dentist twice a year.  Eye exam- Visit your Optometrist or Ophthalmologist yearly.  Drink alcohol in moderation- Limit alcohol intake to one drink or less a day.  Never drink and drive.  Depression- Your emotional health is as important as your physical health.  If you're feeling down or losing interest in things you normally enjoy, please talk to your healthcare provider.  Seat Belts- can save your life; always wear one  Smoke/Carbon Monoxide detectors- These detectors need to be installed on the appropriate level of your home.  Replace batteries at least once a year.  Violence- If anyone is threatening or hurting you, please tell your healthcare provider.  Living Will/ Health care power of attorney- Discuss with your healthcare provider and family.

## 2015-05-31 ENCOUNTER — Ambulatory Visit (INDEPENDENT_AMBULATORY_CARE_PROVIDER_SITE_OTHER): Payer: BC Managed Care – PPO

## 2015-05-31 ENCOUNTER — Telehealth: Payer: Self-pay | Admitting: Physician Assistant

## 2015-05-31 DIAGNOSIS — Z1231 Encounter for screening mammogram for malignant neoplasm of breast: Secondary | ICD-10-CM

## 2015-05-31 DIAGNOSIS — E559 Vitamin D deficiency, unspecified: Secondary | ICD-10-CM | POA: Insufficient documentation

## 2015-05-31 DIAGNOSIS — L570 Actinic keratosis: Secondary | ICD-10-CM | POA: Insufficient documentation

## 2015-05-31 LAB — TSH: TSH: 1.79 m[IU]/L

## 2015-05-31 LAB — LIPID PANEL
Cholesterol: 170 mg/dL (ref 125–200)
HDL: 40 mg/dL — ABNORMAL LOW (ref >=46)
LDL CALC: 114 mg/dL (ref <130)
Total CHOL/HDL Ratio: 4.3 Ratio (ref <=5.0)
Triglycerides: 81 mg/dL (ref <150)
VLDL: 16 mg/dL (ref <30)

## 2015-05-31 LAB — COMPLETE METABOLIC PANEL WITH GFR
ALT: 39 U/L — ABNORMAL HIGH (ref 6–29)
AST: 22 U/L (ref 10–35)
Albumin: 4.2 g/dL (ref 3.6–5.1)
Alkaline Phosphatase: 49 U/L (ref 33–130)
BILIRUBIN TOTAL: 0.6 mg/dL (ref 0.2–1.2)
BUN: 10 mg/dL (ref 7–25)
CHLORIDE: 105 mmol/L (ref 98–110)
CO2: 23 mmol/L (ref 20–31)
CREATININE: 0.57 mg/dL (ref 0.50–1.05)
Calcium: 9.1 mg/dL (ref 8.6–10.4)
GFR, Est African American: >89 mL/min (ref >=60)
GFR, Est Non African American: >89 mL/min (ref >=60)
Glucose, Bld: 94 mg/dL (ref 65–99)
Potassium: 4.2 mmol/L (ref 3.5–5.3)
Sodium: 140 mmol/L (ref 135–146)
TOTAL PROTEIN: 7.2 g/dL (ref 6.1–8.1)

## 2015-05-31 LAB — VITAMIN B12: Vitamin B-12: 1033 pg/mL (ref 200–1100)

## 2015-05-31 LAB — HIV ANTIBODY (ROUTINE TESTING W REFLEX): HIV 1&2 Ab, 4th Generation: NONREACTIVE

## 2015-05-31 LAB — HEPATITIS C ANTIBODY: HCV AB: NEGATIVE

## 2015-05-31 NOTE — Progress Notes (Signed)
Subjective:     Alicia White is a 55 y.o. female and is here for a comprehensive physical exam. The patient reports problems - she would like her bilateral arms looked at. she has "scales" that will not go away. she also wants help with weight. she has bilateral knee pain as well. not tried anything to make better.  she has also had dry cough for 2-3 weeks. No fever, chills, SOB, wheezing. No production. Not tried anything to make better. Taking claritin daily.   Social History   Social History  . Marital Status: Single    Spouse Name: N/A  . Number of Children: N/A  . Years of Education: N/A   Occupational History  . Not on file.   Social History Main Topics  . Smoking status: Never Smoker   . Smokeless tobacco: Never Used  . Alcohol Use: Yes     Comment: occassional  . Drug Use: No  . Sexual Activity: Yes   Other Topics Concern  . Not on file   Social History Narrative   Health Maintenance  Topic Date Due  . Hepatitis C Screening  1960-03-01  . HIV Screening  12/09/1977  . COLONOSCOPY  12/09/2012  . MAMMOGRAM  08/05/2014  . INFLUENZA VACCINE  09/19/2015  . TETANUS/TDAP  03/24/2019    The following portions of the patient's history were reviewed and updated as appropriate: allergies, current medications, past family history, past medical history, past social history, past surgical history and problem list.  Review of Systems Pertinent items noted in HPI and remainder of comprehensive ROS otherwise negative.   Objective:    BP 116/70 mmHg  Pulse 72  Ht 5\' 5"  (1.651 m)  Wt 304 lb (137.893 kg)  BMI 50.59 kg/m2  LMP 06/14/2012 General appearance: alert, cooperative and morbidly obese Head: Normocephalic, without obvious abnormality, atraumatic Eyes: conjunctivae/corneas clear. PERRL, EOM's intact. Fundi benign. Ears: normal TM's and external ear canals both ears Nose: Nares normal. Septum midline. Mucosa normal. No drainage or sinus tenderness. Throat: lips,  mucosa, and tongue normal; teeth and gums normal Neck: no adenopathy, no carotid bruit, no JVD, supple, symmetrical, trachea midline and thyroid not enlarged, symmetric, no tenderness/mass/nodules Back: symmetric, no curvature. ROM normal. No CVA tenderness. Lungs: clear to auscultation bilaterally Heart: regular rate and rhythm, S1, S2 normal, no murmur, click, rub or gallop Abdomen: soft, non-tender; bowel sounds normal; no masses,  no organomegaly Extremities: extremities normal, atraumatic, no cyanosis or edema Pulses: 2+ and symmetric Skin: Skin color, texture, turgor normal. No rashes or lesions or bilateral anterior macules and papules that are scaly with some surrounding erythema.  Lymph nodes: Cervical, supraclavicular, and axillary nodes normal. Neurologic: Alert and oriented X 3, normal strength and tone. Normal symmetric reflexes. Normal coordination and gait    Assessment:    Healthy female exam.     Plan:    CPE- pt declines colonoscopy. Will get set up for cologuard. Mammogram ordered. Hep c and HIV ordered. Fasting labs ordered. Discussed weight see below. Encouraged vitamin d 800 units and calcium 1500mg .   Bilateral knee pain- discussed weight with helping with knee pain. Given mobic to use as needed for OA. She should see sports medicine for management. Consider tumeric 500mg  bid.   Obese- pt declined phentermine. contrave started. Discussed side effects. Follow up in 1 month. TSH and B12 ordered.   AK's- discussed findings on bilateral arms. Sent Fluorouracil to use for next 2-4 weeks. Discussed sunscreen for prevention.   Cough-  seems post viral. No infectious symptoms. Prednisone taper.    See After Visit Summary for Counseling Recommendations

## 2015-05-31 NOTE — Telephone Encounter (Signed)
She needs to be called and come in for cologuard signature.

## 2015-06-01 LAB — VITAMIN D 25 HYDROXY (VIT D DEFICIENCY, FRACTURES): Vit D, 25-Hydroxy: 32 ng/mL (ref 30–100)

## 2015-06-01 NOTE — Telephone Encounter (Signed)
Pt stopping by today to sign form.

## 2015-06-05 ENCOUNTER — Telehealth: Payer: Self-pay | Admitting: *Deleted

## 2015-06-05 NOTE — Telephone Encounter (Signed)
PA initiated for [facility]

## 2015-06-09 NOTE — Telephone Encounter (Signed)
contrave approved for 4 months starting 06/09/2015 through 10/09/2015. PA may be need to be reestablished after aug and will need to submit BMI and diag again  Pt and pharm notified via vm

## 2015-09-21 ENCOUNTER — Other Ambulatory Visit: Payer: Self-pay | Admitting: Physician Assistant

## 2015-09-22 ENCOUNTER — Telehealth: Payer: Self-pay | Admitting: Physician Assistant

## 2015-09-22 NOTE — Telephone Encounter (Signed)
I called pt and left a voicemail for pt to schedule a F/u on her arthritis

## 2015-09-26 ENCOUNTER — Ambulatory Visit: Payer: BC Managed Care – PPO | Admitting: Physician Assistant

## 2015-10-03 ENCOUNTER — Encounter: Payer: Self-pay | Admitting: Physician Assistant

## 2015-10-03 ENCOUNTER — Ambulatory Visit (INDEPENDENT_AMBULATORY_CARE_PROVIDER_SITE_OTHER): Payer: BC Managed Care – PPO | Admitting: Physician Assistant

## 2015-10-03 VITALS — BP 111/68 | HR 72 | Wt 312.0 lb

## 2015-10-03 DIAGNOSIS — L821 Other seborrheic keratosis: Secondary | ICD-10-CM

## 2015-10-03 DIAGNOSIS — M25562 Pain in left knee: Secondary | ICD-10-CM

## 2015-10-03 DIAGNOSIS — M170 Bilateral primary osteoarthritis of knee: Secondary | ICD-10-CM | POA: Diagnosis not present

## 2015-10-03 DIAGNOSIS — M25561 Pain in right knee: Secondary | ICD-10-CM

## 2015-10-03 MED ORDER — MELOXICAM 15 MG PO TABS
15.0000 mg | ORAL_TABLET | Freq: Every day | ORAL | 5 refills | Status: DC
Start: 2015-10-03 — End: 2016-05-29

## 2015-10-03 NOTE — Progress Notes (Signed)
   Subjective:    Patient ID: Tanza Vranich, female    DOB: [birth year], 55 y.o.   MRN: [id number]  HPI Pt is 55 yo female who presents to the clinic for mobic refill for bilateral knee pain. She is using mobic daily. She has been out for a while. She is not ready for knee injections. She could not afford contrave. She is trying to watch diet. Exercise is painful due to knees.    Review of Systems See HPI.     Objective:   Physical Exam  Constitutional: She is oriented to person, place, and time. She appears well-developed and well-nourished.  Obese.   Cardiovascular: Normal rate, regular rhythm and normal heart sounds.   Neurological: She is alert and oriented to person, place, and time.  Skin:  Wart like growth on chest x2. Flesh colored.   Psychiatric: She has a normal mood and affect. Her behavior is normal.          Assessment & Plan:  Primary osteoarthritis of both knees- refilled mobic for as needed use. Discussed follow up with Dr. Darene Lamer for knee injections with steroid and/or orthovisc.   Morbid obesity- pt cannot afford contrave right now. Will get when can afford. Declines phentermine. Discussed low impact exercising and diet restriction. If pt needs refill ok to do when she gets extra money.   seb keratosis- discussed benign nature. Can remove if want. Follow up as needed.

## 2015-10-03 NOTE — Patient Instructions (Signed)
Seborrheic Keratosis Seborrheic keratosis is a common, noncancerous (benign) skin growth. This condition causes waxy, rough, tan, brown, or black spots to appear on the skin. These skin growths can be flat or raised. CAUSES The cause of this condition is not known. RISK FACTORS This condition is more likely to develop in:  People who have a family history of seborrheic keratosis.  People who are 50 or older.  People who are pregnant.  People who have had estrogen replacement therapy. SYMPTOMS This condition often occurs on the face, chest, shoulders, back, or other areas. These growths:  Are usually painless, but may become irritated and itchy.  Can be yellow, brown, black, or other colors.  Are slightly raised or have a flat surface.  Are sometimes rough or wart-like in texture.  Are often waxy on the surface.  Are round or oval-shaped.  Sometimes look like they are "stuck on."  Often occur in groups, but may occur as a single growth. DIAGNOSIS This condition is diagnosed with a medical history and physical exam. A sample of the growth may be tested (skin biopsy). You may need to see a skin specialist (dermatologist). TREATMENT Treatment is not usually needed for this condition, unless the growths are irritated or are often bleeding. You may also choose to have the growths removed if you do not like their appearance. Most commonly, these growths are treated with a procedure in which liquid nitrogen is applied to "freeze" off the growth (cryosurgery). They may also be burned off with electricity or cut off. HOME CARE INSTRUCTIONS  Watch your growth for any changes.  Keep all follow-up visits as told by your health care provider. This is important.  Do not scratch or pick at the growth or growths. This can cause them to become irritated or infected. SEEK MEDICAL CARE IF:  You suddenly have many new growths.  Your growth bleeds, itches, or hurts.  Your growth suddenly  becomes larger or changes color.   This information is not intended to replace advice given to you by your health care provider. Make sure you discuss any questions you have with your health care provider.   Document Released: 03/09/2010 Document Revised: 10/26/2014 Document Reviewed: 06/22/2014 Elsevier Interactive Patient Education 2016 Elsevier Inc.  

## 2016-01-22 ENCOUNTER — Ambulatory Visit (INDEPENDENT_AMBULATORY_CARE_PROVIDER_SITE_OTHER): Payer: BC Managed Care – PPO | Admitting: Sports Medicine

## 2016-01-22 ENCOUNTER — Encounter: Payer: Self-pay | Admitting: Sports Medicine

## 2016-01-22 ENCOUNTER — Ambulatory Visit (INDEPENDENT_AMBULATORY_CARE_PROVIDER_SITE_OTHER): Payer: BC Managed Care – PPO

## 2016-01-22 DIAGNOSIS — M542 Cervicalgia: Secondary | ICD-10-CM

## 2016-01-22 DIAGNOSIS — M545 Low back pain: Secondary | ICD-10-CM | POA: Diagnosis not present

## 2016-01-22 DIAGNOSIS — R0781 Pleurodynia: Secondary | ICD-10-CM | POA: Diagnosis not present

## 2016-01-22 MED ORDER — PREDNISONE 50 MG PO TABS: ORAL_TABLET | ORAL | 0 refills | Status: DC
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
## 2016-02-06 ENCOUNTER — Encounter: Payer: Self-pay | Admitting: Sports Medicine

## 2016-02-06 ENCOUNTER — Ambulatory Visit (INDEPENDENT_AMBULATORY_CARE_PROVIDER_SITE_OTHER): Payer: BC Managed Care – PPO | Admitting: Sports Medicine

## 2016-02-06 DIAGNOSIS — G8929 Other chronic pain: Secondary | ICD-10-CM | POA: Diagnosis not present

## 2016-02-06 DIAGNOSIS — M545 Low back pain: Secondary | ICD-10-CM | POA: Diagnosis not present

## 2016-02-06 MED ORDER — PHENTERMINE HCL 37.5 MG PO TABS: ORAL_TABLET | ORAL | 0 refills | Status: DC

## 2016-02-06 MED ORDER — GABAPENTIN 100 MG PO CAPS: 100.0000 mg | ORAL_CAPSULE | Freq: Two times a day (BID) | ORAL | 11 refills | Status: DC
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
## 2016-02-06 NOTE — Assessment & Plan Note (Signed)
Adding phentermine, she was unable to afford the co-pay for Contrave even with a coupon Return to see PCP in one month for a weight check
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
## 2016-03-04 ENCOUNTER — Ambulatory Visit: Payer: BC Managed Care – PPO

## 2016-03-04 ENCOUNTER — Ambulatory Visit (INDEPENDENT_AMBULATORY_CARE_PROVIDER_SITE_OTHER): Payer: BC Managed Care – PPO | Admitting: Sports Medicine

## 2016-03-04 ENCOUNTER — Encounter: Payer: Self-pay | Admitting: Sports Medicine

## 2016-03-04 ENCOUNTER — Ambulatory Visit: Payer: BC Managed Care – PPO | Admitting: Sports Medicine

## 2016-03-04 DIAGNOSIS — G8929 Other chronic pain: Secondary | ICD-10-CM

## 2016-03-04 DIAGNOSIS — M545 Low back pain: Secondary | ICD-10-CM | POA: Diagnosis not present

## 2016-03-04 MED ORDER — PHENTERMINE HCL 37.5 MG PO TABS: ORAL_TABLET | ORAL | 0 refills | Status: DC

## 2016-03-04 NOTE — Assessment & Plan Note (Signed)
15 pound weight loss after the first month on phentermine. Refilling phentermine however she needs to follow up with her PCP for further management. We did discuss bariatric type surgery.
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
## 2016-04-04 ENCOUNTER — Ambulatory Visit: Payer: BC Managed Care – PPO

## 2016-04-08 ENCOUNTER — Ambulatory Visit (INDEPENDENT_AMBULATORY_CARE_PROVIDER_SITE_OTHER): Payer: BC Managed Care – PPO | Admitting: Physician Assistant

## 2016-04-08 VITALS — BP 125/72 | HR 90 | Ht 65.5 in | Wt 297.1 lb

## 2016-04-08 DIAGNOSIS — R635 Abnormal weight gain: Secondary | ICD-10-CM | POA: Diagnosis not present

## 2016-04-08 MED ORDER — PHENTERMINE HCL 37.5 MG PO TABS: ORAL_TABLET | ORAL | 0 refills | Status: DC

## 2016-04-08 NOTE — Progress Notes (Signed)
   Subjective:    Patient ID: Alicia White, female    DOB: 03/01/1960, 56 y.o.   MRN: [id number]  HPI Pt is here for a BP and weight check. Pt denies chest pain, palpitations, shortness of breath, or medication problems.    Review of Systems  Respiratory: Negative for shortness of breath.   Cardiovascular: Negative for chest pain and palpitations.       Objective:   Physical Exam        Assessment & Plan:  Pt has lost weight.A refill for phentermine was sent to pharmacy per [facility] PA-C. Pt advised to schedule a nurse visit in 30 days.

## 2016-05-03 ENCOUNTER — Ambulatory Visit (INDEPENDENT_AMBULATORY_CARE_PROVIDER_SITE_OTHER): Payer: BC Managed Care – PPO | Admitting: Physician Assistant

## 2016-05-03 ENCOUNTER — Encounter (INDEPENDENT_AMBULATORY_CARE_PROVIDER_SITE_OTHER): Payer: Self-pay

## 2016-05-03 MED ORDER — PHENTERMINE HCL 37.5 MG PO TABS: ORAL_TABLET | ORAL | 0 refills | Status: DC

## 2016-05-03 NOTE — Progress Notes (Signed)
Pt is here for a BP and weight check.  Denies trouble sleeping, palpitations, or medication problems. Pt has lost weight.  A refill for phentermine will be faxed to the pharmacy.  Pt advised to schedule an appointment with nurse in 30 days.

## 2016-05-16 ENCOUNTER — Other Ambulatory Visit: Payer: Self-pay | Admitting: Physician Assistant

## 2016-05-29 ENCOUNTER — Ambulatory Visit (INDEPENDENT_AMBULATORY_CARE_PROVIDER_SITE_OTHER): Payer: BC Managed Care – PPO | Admitting: Physician Assistant

## 2016-05-29 MED ORDER — SPIRONOLACTONE 50 MG PO TABS: 50.0000 mg | ORAL_TABLET | Freq: Two times a day (BID) | ORAL | 5 refills | Status: DC

## 2016-05-29 MED ORDER — MELOXICAM 15 MG PO TABS: 15.0000 mg | ORAL_TABLET | Freq: Every day | ORAL | 5 refills | Status: DC

## 2016-05-29 MED ORDER — PHENTERMINE HCL 37.5 MG PO TABS: ORAL_TABLET | ORAL | 0 refills | Status: DC

## 2016-05-29 NOTE — Progress Notes (Addendum)
   Subjective:    Patient ID: Alicia White, female    DOB: Mar 15, 1960, 56 y.o.   MRN: [id number]  Alicia White is here for blood pressure and weight check. Diet and exercise is going well. Denies trouble sleeping or palpitations.        Review of Systems  Constitutional: Negative.   HENT: Negative.   Respiratory: Negative.   Cardiovascular: Negative.   Gastrointestinal: Negative.   Genitourinary: Negative.   Psychiatric/Behavioral: Negative.        Objective:   Physical Exam        Assessment & Plan:  Abnormal weight gain - Patient has lost weight. A refill on phentermine faxed to pharmacy. Patient advised to schedule a follow up in 4 weeks.    Agree with the above plan. Iran Planas PA-C

## 2016-06-26 ENCOUNTER — Ambulatory Visit (INDEPENDENT_AMBULATORY_CARE_PROVIDER_SITE_OTHER): Payer: BC Managed Care – PPO | Admitting: Physician Assistant

## 2016-06-26 ENCOUNTER — Encounter: Payer: Self-pay | Admitting: Physician Assistant

## 2016-06-26 MED ORDER — PHENTERMINE HCL 37.5 MG PO TABS: ORAL_TABLET | ORAL | 0 refills | Status: DC

## 2016-06-26 NOTE — Progress Notes (Signed)
Patient is here for blood pressure and weight check. Denies any trouble sleeping, palpitations, or any other medication problems. Patient has lost weight. A refill for Phentermine will be sent to patient preferred pharmacy. Advised Patient to set a goal for 4 lbs weight loss over the next month. Patient advised to schedule a four week weight check with her PCP, she has not seen her since starting Phentermine. Verbalized understanding, no further questions.  Agree with above plan. Iran Planas PA-C

## 2016-07-19 ENCOUNTER — Encounter: Payer: Self-pay | Admitting: Physician Assistant

## 2016-07-19 ENCOUNTER — Ambulatory Visit (INDEPENDENT_AMBULATORY_CARE_PROVIDER_SITE_OTHER): Payer: BC Managed Care – PPO | Admitting: Physician Assistant

## 2016-07-19 MED ORDER — PHENTERMINE HCL 37.5 MG PO TABS: ORAL_TABLET | ORAL | 0 refills | Status: DC

## 2016-07-19 MED ORDER — LORCASERIN HCL 10 MG PO TABS: ORAL_TABLET | ORAL | 1 refills | Status: DC
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
## 2016-07-21 ENCOUNTER — Encounter: Payer: Self-pay | Admitting: Physician Assistant

## 2016-07-22 ENCOUNTER — Other Ambulatory Visit: Payer: Self-pay | Admitting: Physician Assistant

## 2016-07-22 MED ORDER — PHENTERMINE HCL 8 MG PO TABS: 1.0000 | ORAL_TABLET | Freq: Every day | ORAL | 0 refills | Status: DC

## 2016-07-22 MED ORDER — TOPIRAMATE 50 MG PO TABS: ORAL_TABLET | ORAL | 1 refills | Status: DC

## 2016-07-22 NOTE — Progress Notes (Signed)
Her insurance will not pay for any weight loss medications. Will try topamax 50mg  daily with 8mg  of phentermine daily. Follow up in 2 months.

## 2016-09-12 ENCOUNTER — Ambulatory Visit (INDEPENDENT_AMBULATORY_CARE_PROVIDER_SITE_OTHER): Payer: BC Managed Care – PPO | Admitting: Sports Medicine

## 2016-09-12 ENCOUNTER — Encounter: Payer: Self-pay | Admitting: Sports Medicine

## 2016-09-12 DIAGNOSIS — L309 Dermatitis, unspecified: Secondary | ICD-10-CM | POA: Diagnosis not present

## 2016-09-12 MED ORDER — HYDROXYZINE HCL 50 MG PO TABS: 50.0000 mg | ORAL_TABLET | Freq: Three times a day (TID) | ORAL | 3 refills | Status: DC | PRN

## 2016-09-12 MED ORDER — TRIAMCINOLONE ACETONIDE 0.5 % EX CREA: 1.0000 "application " | TOPICAL_CREAM | Freq: Two times a day (BID) | CUTANEOUS | 3 refills | Status: DC

## 2016-09-12 NOTE — Assessment & Plan Note (Signed)
Suspect solar dermatitis. Adding topical triamcinolone, hydroxyzine.  Return if no better in 2-4 weeks. Advised SPF 50 or greater and sleeves.

## 2016-09-12 NOTE — Progress Notes (Signed)
  Subjective:    CC: Itching arms  HPI: This is a pleasant 56 year old female, for the past several weeks she's had increasing itching on both of her arms extending to her back, she does tend to get a lot of sun. She also tells me of episodes in the past when she was a child where significant anxiety would provoke widespread itching. No changes in perfumes, lotions, detergents.  Past medical history:  Negative.  See flowsheet/record as well for more information.  Surgical history: Negative.  See flowsheet/record as well for more information.  Family history: Negative.  See flowsheet/record as well for more information.  Social history: Negative.  See flowsheet/record as well for more information.  Allergies, and medications have been entered into the medical record, reviewed, and no changes needed.   Review of Systems: No fevers, chills, night sweats, weight loss, chest pain, or shortness of breath.   Objective:    General: Well Developed, well nourished, and in no acute distress.  Neuro: Alert and oriented x3, extra-ocular muscles intact, sensation grossly intact.  HEENT: Normocephalic, atraumatic, pupils equal round reactive to light, neck supple, no masses, no lymphadenopathy, thyroid nonpalpable.  Skin: Warm and dry, There is significant leathery tanning over both of her arms, back, there are some signs of excoriation, without bacterial superinfection. Cardiac: Regular rate and rhythm, no murmurs rubs or gallops, no lower extremity edema.  Respiratory: Clear to auscultation bilaterally. Not using accessory muscles, speaking in full sentences.  Impression and Recommendations:    Dermatitis Suspect solar dermatitis. Adding topical triamcinolone, hydroxyzine.  Return if no better in 2-4 weeks. Advised SPF 50 or greater and sleeves.  I spent 25 minutes with this patient, greater than 50% was face-to-face time counseling regarding the above diagnoses

## 2016-10-17 ENCOUNTER — Ambulatory Visit: Payer: BC Managed Care – PPO | Admitting: Sports Medicine

## 2016-12-27 ENCOUNTER — Other Ambulatory Visit: Payer: Self-pay | Admitting: Physician Assistant

## 2017-02-07 ENCOUNTER — Other Ambulatory Visit: Payer: Self-pay | Admitting: Sports Medicine

## 2017-02-07 DIAGNOSIS — G8929 Other chronic pain: Secondary | ICD-10-CM

## 2017-02-07 DIAGNOSIS — M545 Low back pain, unspecified: Secondary | ICD-10-CM

## 2017-03-26 ENCOUNTER — Ambulatory Visit (INDEPENDENT_AMBULATORY_CARE_PROVIDER_SITE_OTHER): Payer: BC Managed Care – PPO

## 2017-03-26 ENCOUNTER — Ambulatory Visit (INDEPENDENT_AMBULATORY_CARE_PROVIDER_SITE_OTHER): Payer: BC Managed Care – PPO | Admitting: Physician Assistant

## 2017-03-26 ENCOUNTER — Encounter: Payer: Self-pay | Admitting: Physician Assistant

## 2017-03-26 VITALS — BP 117/47 | HR 67 | Ht 65.5 in | Wt 284.0 lb

## 2017-03-26 DIAGNOSIS — G5602 Carpal tunnel syndrome, left upper limb: Secondary | ICD-10-CM | POA: Diagnosis not present

## 2017-03-26 DIAGNOSIS — R221 Localized swelling, mass and lump, neck: Secondary | ICD-10-CM | POA: Diagnosis not present

## 2017-03-26 DIAGNOSIS — Z1231 Encounter for screening mammogram for malignant neoplasm of breast: Secondary | ICD-10-CM | POA: Diagnosis not present

## 2017-03-26 DIAGNOSIS — Z0000 Encounter for general adult medical examination without abnormal findings: Secondary | ICD-10-CM

## 2017-03-26 DIAGNOSIS — R229 Localized swelling, mass and lump, unspecified: Secondary | ICD-10-CM

## 2017-03-26 DIAGNOSIS — Z13220 Encounter for screening for lipoid disorders: Secondary | ICD-10-CM | POA: Diagnosis not present

## 2017-03-26 DIAGNOSIS — Z131 Encounter for screening for diabetes mellitus: Secondary | ICD-10-CM

## 2017-03-26 MED ORDER — PHENTERMINE HCL 37.5 MG PO TABS: 37.5000 mg | ORAL_TABLET | Freq: Every day | ORAL | 0 refills | Status: DC

## 2017-03-26 NOTE — Progress Notes (Signed)
Call pt: normal mammogram. Follow up in 1 year.

## 2017-03-26 NOTE — Progress Notes (Signed)
Subjective:     Alicia White is a 57 y.o. female and is here for a comprehensive physical exam. The patient reports problems - see below.    Patient is very motivated to work on her weight.  Approximately 1 year ago she was placed on phentermine for a few months and dropped almost 50 pounds.  She is only gained 7 pounds back from what she originally lost.  She has been doing weight watchers.  She is also seeing a nutritionist.  She is really trying to limit her caloric intake.  She is eating a lot of yogurt, almonds, salads, lean protein and veggies.  She is not doing a lot of exercise but she is trying to walk a few times a week.  She feels like she is stuck at this current weight. She is very interested in restarting phentermine for another few months.  It has been over a year since she has been on it.  And she feels like she has relatively maintained her weight.  She does not have any side effects with this medication.  She does discuss some numbness and tingling in her left thumb index middle finger.  It does not go past her wrist.  She does do a lot of computer work and typing.  She had similar symptoms in 2017 and was told she had carpal tunnel.  She was given night splints but she has not restarted use of them.  She has had this pain ongoing off and on for the past few weeks.  She also wants a skin mass looked at on her right side of neck. No pain associated. Present for 6 months. Not changed in size. Not red, tender, swollen.    Social History   Socioeconomic History  . Marital status: Single    Spouse name: Not on file  . Number of children: Not on file  . Years of education: Not on file  . Highest education level: Not on file  Social Needs  . Financial resource strain: Not on file  . Food insecurity - worry: Not on file  . Food insecurity - inability: Not on file  . Transportation needs - medical: Not on file  . Transportation needs - non-medical: Not on file  Occupational  History  . Not on file  Tobacco Use  . Smoking status: Never Smoker  . Smokeless tobacco: Never Used  Substance and Sexual Activity  . Alcohol use: Yes    Comment: occassional  . Drug use: No  . Sexual activity: Yes  Other Topics Concern  . Not on file  Social History Narrative  . Not on file   Health Maintenance  Topic Date Due  . Fecal DNA (Cologuard)  12/09/2012  . MAMMOGRAM  05/30/2016  . INFLUENZA VACCINE  11/26/2017 (Originally 09/18/2016)  . TETANUS/TDAP  03/24/2019  . Hepatitis C Screening  Completed  . HIV Screening  Completed    The following portions of the patient's history were reviewed and updated as appropriate: allergies, current medications, past family history, past medical history, past social history, past surgical history and problem list.  Review of Systems Pertinent items noted in HPI and remainder of comprehensive ROS otherwise negative.   Objective:    BP (!) 117/47   Pulse 67   Ht 5' 5.5" (1.664 m)   Wt 284 lb (128.8 kg)   LMP 06/14/2012   BMI 46.54 kg/m  General appearance: alert, cooperative, appears stated age and morbidly obese Head: Normocephalic, without obvious  abnormality, atraumatic Eyes: conjunctivae/corneas clear. PERRL, EOM's intact. Fundi benign. Ears: normal TM's and external ear canals both ears Nose: Nares normal. Septum midline. Mucosa normal. No drainage or sinus tenderness. Throat: lips, mucosa, and tongue normal; teeth and gums normal Neck: no adenopathy, no carotid bruit, no JVD, supple, symmetrical, trachea midline and thyroid not enlarged, symmetric, no tenderness/mass/nodules Back: symmetric, no curvature. ROM normal. No CVA tenderness. Lungs: clear to auscultation bilaterally Heart: regular rate and rhythm, S1, S2 normal, no murmur, click, rub or gallop Abdomen: soft, non-tender; bowel sounds normal; no masses,  no organomegaly Extremities: extremities normal, atraumatic, no cyanosis or edema Pulses: 2+ and  symmetric Skin: Skin color, texture, turgor normal. No rashes or lesions .75cm by .75cm mobile,non red, non tender mass under skin. No swelling.  Lymph nodes: Cervical, supraclavicular, and axillary nodes normal. Neurologic: Alert and oriented X 3, normal strength and tone. Normal symmetric reflexes. Normal coordination and gait    Assessment:    Healthy female exam.      Plan:      Marland KitchenMarland KitchenTeylor was seen today for annual exam and cough.  Diagnoses and all orders for this visit:  Routine physical examination -     Lipid Panel w/reflex Direct LDL -     COMPLETE METABOLIC PANEL WITH GFR -     TSH -     CBC with Differential/Platelet -     B12  Visit for screening mammogram -     MM SCREENING BREAST TOMO BILATERAL  Morbid obesity ([place]) -     phentermine (ADIPEX-P) 37.5 MG tablet; Take 1 tablet (37.5 mg total) by mouth daily before breakfast.  Left carpal tunnel syndrome -     TSH  Screening for lipid disorders -     Lipid Panel w/reflex Direct LDL  Screening for diabetes mellitus -     COMPLETE METABOLIC PANEL WITH GFR  Skin mass -     TSH -     CBC with Differential/Platelet -     US SOFT TISSUE HEAD & NECK (NON-THYROID)  .[name] Depression screen PHQ 2/9 03/26/2017  Decreased Interest 0  Down, Depressed, Hopeless 0  PHQ - 2 Score 0   .. Discussed 150 minutes of exercise a week.  Encouraged vitamin D 1000 units and Calcium 1300mg  or 4 servings of dairy a day.  [name].Discussed low carb diet with 1500 calories and 80g of protein.  Exercising at least 150 minutes a week.  [employer]itness Pal could be a Microbiologist.  Phentermine restarted. Follow up in nurse visit in 1 month.   Mammogram ordered today.  No need for pap due to hysterectomy.  cologuard sent off this week.  Discussed shingles vaccine. Will call insurance.   Start wearing night splints. Continue NSAID. Follow up with EMG's if symptoms persist.   Unclear etiology of skin mass. Likely lipoma. Does not appear to be  sebaceous cyst or lymph node. Will get u/s of neck.    See After Visit Summary for Counseling Recommendations

## 2017-03-26 NOTE — Patient Instructions (Addendum)
Carpal Tunnel Syndrome Carpal tunnel syndrome is a condition that causes pain in your hand and arm. The carpal tunnel is a narrow area that is on the palm side of your wrist. Repeated wrist motion or certain diseases may cause swelling in the tunnel. This swelling can pinch the main nerve in the wrist (median nerve). Follow these instructions at home: If you have a splint:  Wear it as told by your doctor. Remove it only as told by your doctor.  Loosen the splint if your fingers: ? Become numb and tingle. ? Turn blue and cold.  Keep the splint clean and dry. General instructions  Take over-the-counter and prescription medicines only as told by your doctor.  Rest your wrist from any activity that may be causing your pain. If needed, talk to your employer about changes that can be made in your work, such as getting a wrist pad to use while typing.  If directed, apply ice to the painful area: ? Put ice in a plastic bag. ? Place a towel between your skin and the bag. ? Leave the ice on for 20 minutes, 2-3 times per day.  Keep all follow-up visits as told by your doctor. This is important.  Do any exercises as told by your doctor, physical therapist, or occupational therapist. Contact a doctor if:  You have new symptoms.  Medicine does not help your pain.  Your symptoms get worse. This information is not intended to replace advice given to you by your health care provider. Make sure you discuss any questions you have with your health care provider. Document Released: 01/24/2011 Document Revised: 07/13/2015 Document Reviewed: 06/22/2014 Elsevier Interactive Patient Education  2018 [place]  Get These Tests  Blood Pressure- Have your blood pressure checked by your healthcare provider at least once a year.  Normal blood pressure is 120/80.  Weight- Have your body mass index (BMI) calculated to screen for obesity.  BMI is a measure of body fat based on height  and weight.  You can calculate your own BMI at [employer]  Cholesterol- Have your cholesterol checked every year.  Diabetes- Have your blood sugar checked every year if you have high blood pressure, high cholesterol, a family history of diabetes or if you are overweight.  Pap Test - Have a pap test every 1 to 5 years if you have been sexually active.  If you are older than 65 and recent pap tests have been normal you may not need additional pap tests.  In addition, if you have had a hysterectomy  for benign disease additional pap tests are not necessary.  Mammogram-Yearly mammograms are essential for early detection of breast cancer  Screening for Colon Cancer- Colonoscopy starting at age 24. Screening may begin sooner depending on your family history and other health conditions.  Follow up colonoscopy as directed by your Gastroenterologist.  Screening for Osteoporosis- Screening begins at age 3 with bone density scanning, sooner if you are at higher risk for developing Osteoporosis.  Get these medicines  Calcium with Vitamin D- Your body requires 1200-1500 mg of Calcium a day and [id number] IU of Vitamin D a day.  You can only absorb 500 mg of Calcium at a time therefore Calcium must be taken in 2 or 3 separate doses throughout the day.  Hormones- Hormone therapy has been associated with increased risk for certain cancers and heart disease.  Talk to your healthcare provider about if you need relief from menopausal symptoms.  Aspirin- Ask your healthcare provider about taking Aspirin to prevent Heart Disease and Stroke.  Get these Immuniztions  Flu shot- Every fall  Pneumonia shot- Once after the age of 25; if you are younger ask your healthcare provider if you need a pneumonia shot.  Tetanus- Every ten years.  Zostavax- Once after the age of 84 to prevent shingles.  Take these steps  Don't smoke- Your healthcare provider can help you quit. For tips on how to quit, ask  your healthcare provider or go to www.smokefree.gov or call 1-800 QUIT-NOW.  Be physically active- Exercise 5 days a week for a minimum of 30 minutes.  If you are not already physically active, start slow and gradually work up to 30 minutes of moderate physical activity.  Try walking, dancing, bike riding, swimming, etc.  Eat a healthy diet- Eat a variety of healthy foods such as fruits, vegetables, whole grains, low fat milk, low fat cheeses, yogurt, lean meats, chicken, fish, eggs, dried beans, tofu, etc.  For more information go to www.thenutritionsource.org  Dental visit- Brush and floss teeth twice daily; visit your dentist twice a year.  Eye exam- Visit your Optometrist or Ophthalmologist yearly.  Drink alcohol in moderation- Limit alcohol intake to one drink or less a day.  Never drink and drive.  Depression- Your emotional health is as important as your physical health.  If you're feeling down or losing interest in things you normally enjoy, please talk to your healthcare provider.  Seat Belts- can save your life; always wear one  Smoke/Carbon Monoxide detectors- These detectors need to be installed on the appropriate level of your home.  Replace batteries at least once a year.  Violence- If anyone is threatening or hurting you, please tell your healthcare provider.  Living Will/ Health care power of attorney- Discuss with your healthcare provider and family.

## 2017-03-27 ENCOUNTER — Encounter: Payer: Self-pay | Admitting: Physician Assistant

## 2017-03-27 DIAGNOSIS — E785 Hyperlipidemia, unspecified: Secondary | ICD-10-CM | POA: Insufficient documentation

## 2017-03-27 LAB — CBC WITH DIFFERENTIAL/PLATELET
Basophils Absolute: 32 cells/uL (ref 0–200)
Basophils Relative: 0.4 %
EOS PCT: 3.1 %
Eosinophils Absolute: 248 cells/uL (ref 15–500)
HEMATOCRIT: 40.4 % (ref 35.0–45.0)
HEMOGLOBIN: 13.8 g/dL (ref 11.7–15.5)
LYMPHS ABS: 1944 {cells}/uL (ref 850–3900)
MCH: 29.9 pg (ref 27.0–33.0)
MCHC: 34.2 g/dL (ref 32.0–36.0)
MCV: 87.4 fL (ref 80.0–100.0)
MPV: 10.8 fL (ref 7.5–12.5)
Monocytes Relative: 8 %
NEUTROS ABS: 5136 {cells}/uL (ref 1500–7800)
Neutrophils Relative %: 64.2 %
Platelets: 238 10*3/uL (ref 140–400)
RBC: 4.62 10*6/uL (ref 3.80–5.10)
RDW: 13.1 % (ref 11.0–15.0)
Total Lymphocyte: 24.3 %
WBC mixed population: 640 cells/uL (ref 200–950)
WBC: 8 10*3/uL (ref 3.8–10.8)

## 2017-03-27 LAB — LIPID PANEL W/REFLEX DIRECT LDL
CHOL/HDL RATIO: 4.3 (calc) (ref <5.0)
Cholesterol: 217 mg/dL — ABNORMAL HIGH (ref <200)
HDL: 50 mg/dL — ABNORMAL LOW (ref >50)
LDL CHOLESTEROL (CALC): 146 mg/dL — AB
Non-HDL Cholesterol (Calc): 167 mg/dL (calc) — ABNORMAL HIGH (ref <130)
Triglycerides: 100 mg/dL (ref <150)

## 2017-03-27 LAB — COMPLETE METABOLIC PANEL WITH GFR
AG Ratio: 1.2 (calc) (ref 1.0–2.5)
ALKALINE PHOSPHATASE (APISO): 53 U/L (ref 33–130)
ALT: 28 U/L (ref 6–29)
AST: 18 U/L (ref 10–35)
Albumin: 4.2 g/dL (ref 3.6–5.1)
BUN: 10 mg/dL (ref 7–25)
CO2: 27 mmol/L (ref 20–32)
CREATININE: 0.68 mg/dL (ref 0.50–1.05)
Calcium: 9.5 mg/dL (ref 8.6–10.4)
Chloride: 105 mmol/L (ref 98–110)
GFR, Est African American: 115 mL/min/{1.73_m2} (ref >=60)
GFR, Est Non African American: 99 mL/min/{1.73_m2} (ref >=60)
GLUCOSE: 81 mg/dL (ref 65–99)
Globulin: 3.4 g/dL (calc) (ref 1.9–3.7)
Potassium: 4.2 mmol/L (ref 3.5–5.3)
Sodium: 141 mmol/L (ref 135–146)
Total Bilirubin: 0.6 mg/dL (ref 0.2–1.2)
Total Protein: 7.6 g/dL (ref 6.1–8.1)

## 2017-03-27 LAB — TSH: TSH: 1.93 m[IU]/L

## 2017-03-27 LAB — VITAMIN B12

## 2017-03-28 ENCOUNTER — Encounter: Payer: Self-pay | Admitting: Physician Assistant

## 2017-04-18 ENCOUNTER — Encounter: Payer: Self-pay | Admitting: Physician Assistant

## 2017-04-18 ENCOUNTER — Ambulatory Visit: Payer: BC Managed Care – PPO | Admitting: Physician Assistant

## 2017-04-18 DIAGNOSIS — R05 Cough: Secondary | ICD-10-CM | POA: Diagnosis not present

## 2017-04-18 DIAGNOSIS — R059 Cough, unspecified: Secondary | ICD-10-CM

## 2017-04-18 MED ORDER — PHENTERMINE HCL 37.5 MG PO TABS: 37.5000 mg | ORAL_TABLET | Freq: Every day | ORAL | 0 refills | Status: DC

## 2017-04-18 NOTE — Patient Instructions (Signed)
Nurse visit
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
## 2017-05-20 ENCOUNTER — Ambulatory Visit (INDEPENDENT_AMBULATORY_CARE_PROVIDER_SITE_OTHER): Payer: BC Managed Care – PPO | Admitting: Physician Assistant

## 2017-05-20 MED ORDER — PHENTERMINE HCL 37.5 MG PO TABS: 37.5000 mg | ORAL_TABLET | Freq: Every day | ORAL | 0 refills | Status: DC

## 2017-05-20 NOTE — Progress Notes (Signed)
Pt presented today for weight check. Per pt - she was in [place] for 4 days in the past week. States she has retain water in her body. She was apprehensive with weight check, because she felt "heavier". Last weight check was 279 lbs. Today' vitals for weight check was 281.06 lbs, blood pressure reading was 93/47, pulse 77. Recheck blood pressure after 10 mins - reading was better 111/58, pulse 77. Pt informed to make an appt for 1 mth from today. Medication is pending for approval. Pt notified that she will be call regarding any changes or updates.

## 2017-07-10 ENCOUNTER — Other Ambulatory Visit: Payer: Self-pay | Admitting: Physician Assistant

## 2017-08-18 ENCOUNTER — Other Ambulatory Visit: Payer: Self-pay | Admitting: Physician Assistant

## 2017-10-31 ENCOUNTER — Other Ambulatory Visit: Payer: Self-pay | Admitting: Physician Assistant

## 2018-01-03 ENCOUNTER — Other Ambulatory Visit: Payer: Self-pay | Admitting: Physician Assistant

## 2018-01-23 ENCOUNTER — Other Ambulatory Visit: Payer: Self-pay | Admitting: Physician Assistant

## 2018-02-06 ENCOUNTER — Other Ambulatory Visit: Payer: Self-pay | Admitting: Physician Assistant

## 2018-03-14 ENCOUNTER — Other Ambulatory Visit: Payer: Self-pay | Admitting: Physician Assistant

## 2018-03-14 ENCOUNTER — Other Ambulatory Visit: Payer: Self-pay | Admitting: Sports Medicine

## 2018-03-14 DIAGNOSIS — M545 Low back pain, unspecified: Secondary | ICD-10-CM

## 2018-03-14 DIAGNOSIS — G8929 Other chronic pain: Secondary | ICD-10-CM

## 2018-04-03 ENCOUNTER — Other Ambulatory Visit: Payer: Self-pay | Admitting: Physician Assistant

## 2018-04-13 ENCOUNTER — Other Ambulatory Visit: Payer: Self-pay | Admitting: Physician Assistant

## 2018-04-17 ENCOUNTER — Ambulatory Visit: Payer: BC Managed Care – PPO | Admitting: Physician Assistant

## 2018-04-17 ENCOUNTER — Encounter: Payer: Self-pay | Admitting: Physician Assistant

## 2018-04-17 VITALS — BP 123/87 | HR 84 | Temp 98.4°F | Wt 267.0 lb

## 2018-04-17 DIAGNOSIS — M25562 Pain in left knee: Secondary | ICD-10-CM

## 2018-04-17 DIAGNOSIS — M25561 Pain in right knee: Secondary | ICD-10-CM

## 2018-04-17 DIAGNOSIS — K209 Esophagitis, unspecified without bleeding: Secondary | ICD-10-CM

## 2018-04-17 DIAGNOSIS — G8929 Other chronic pain: Secondary | ICD-10-CM

## 2018-04-17 DIAGNOSIS — J0140 Acute pansinusitis, unspecified: Secondary | ICD-10-CM

## 2018-04-17 MED ORDER — DICLOFENAC SODIUM 1 % TD GEL: 4.0000 g | Freq: Four times a day (QID) | TRANSDERMAL | 1 refills | Status: DC

## 2018-04-17 MED ORDER — AMOXICILLIN-POT CLAVULANATE 875-125 MG PO TABS: 1.0000 | ORAL_TABLET | Freq: Two times a day (BID) | ORAL | 0 refills | Status: DC

## 2018-04-17 MED ORDER — SUCRALFATE 1 GM/10ML PO SUSP: 1.0000 g | Freq: Three times a day (TID) | ORAL | 0 refills | Status: DC

## 2018-04-17 MED ORDER — METHYLPREDNISOLONE 4 MG PO TBPK: ORAL_TABLET | ORAL | 0 refills | Status: DC

## 2018-04-17 MED ORDER — CELECOXIB 200 MG PO CAPS: ORAL_CAPSULE | ORAL | 5 refills | Status: DC

## 2018-04-17 MED ORDER — NALTREXONE-BUPROPION HCL ER 8-90 MG PO TB12: ORAL_TABLET | ORAL | 0 refills | Status: DC

## 2018-04-17 NOTE — Patient Instructions (Signed)
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
## 2018-04-20 ENCOUNTER — Encounter: Payer: Self-pay | Admitting: Physician Assistant

## 2018-04-21 ENCOUNTER — Other Ambulatory Visit: Payer: Self-pay

## 2018-04-21 ENCOUNTER — Telehealth: Payer: Self-pay | Admitting: Physician Assistant

## 2018-04-21 DIAGNOSIS — G8929 Other chronic pain: Secondary | ICD-10-CM

## 2018-04-21 DIAGNOSIS — M545 Low back pain, unspecified: Secondary | ICD-10-CM

## 2018-04-21 MED ORDER — LIRAGLUTIDE -WEIGHT MANAGEMENT 18 MG/3ML ~~LOC~~ SOPN: 0.6000 mg | PEN_INJECTOR | Freq: Every day | SUBCUTANEOUS | 1 refills | Status: DC

## 2018-04-21 MED ORDER — INSULIN PEN NEEDLE 32G X 6 MM MISC: 0 refills | Status: DC

## 2018-04-21 NOTE — Telephone Encounter (Signed)
Received a fax from insurance that Kirke Shaggy is the preferred medication and I have spoken with the patient to let her know this is the preferred. She is ok with switching to [place] and wants to come in for a nurse visit to go over how to give. I have called [facility] to cancel the Contrave. Please advise.

## 2018-04-21 NOTE — Telephone Encounter (Signed)
Sent rx. Please make sure patient is aware to taper up .6mg  for 1 week, 1.2mg  for 1 week, 1.8mg  for 1 week, 2.4mg  for 1 week then 3mg  daily.  Come in for nurse visit to teach how to use.

## 2018-04-21 NOTE — Telephone Encounter (Signed)
Patient reports that she will come in the office for a teaching and go over the dosing of [place] with the nurse.

## 2018-05-11 ENCOUNTER — Encounter: Payer: Self-pay | Admitting: Physician Assistant

## 2018-05-11 ENCOUNTER — Other Ambulatory Visit: Payer: Self-pay | Admitting: Physician Assistant

## 2018-05-11 MED ORDER — MELOXICAM 15 MG PO TABS: 15.0000 mg | ORAL_TABLET | Freq: Every day | ORAL | 3 refills | Status: DC

## 2018-05-11 NOTE — Telephone Encounter (Signed)
MO

## 2018-05-12 ENCOUNTER — Other Ambulatory Visit: Payer: Self-pay

## 2018-05-12 NOTE — Telephone Encounter (Signed)
She was on if for excessive hair growth. Does she still want to be on it? Let her know nurse took off list.

## 2018-05-12 NOTE — Telephone Encounter (Signed)
Alicia White called for a refill on spironolactone 50 mg once daily. This medication was discontinued by Jolayne Haines, stating it was per provider. Please advise.

## 2018-05-14 MED ORDER — SPIRONOLACTONE 50 MG PO TABS: 50.0000 mg | ORAL_TABLET | Freq: Two times a day (BID) | ORAL | 5 refills | Status: DC

## 2018-05-14 NOTE — Telephone Encounter (Signed)
Pt states that she does want to stay on medication because she is using mainly for fluid retention instead of hair loss.   Wants RF sent to [facility].   RX pended

## 2018-05-18 ENCOUNTER — Other Ambulatory Visit: Payer: Self-pay

## 2018-05-18 ENCOUNTER — Ambulatory Visit (INDEPENDENT_AMBULATORY_CARE_PROVIDER_SITE_OTHER): Payer: BC Managed Care – PPO | Admitting: Physician Assistant

## 2018-05-18 ENCOUNTER — Encounter: Payer: Self-pay | Admitting: Physician Assistant

## 2018-05-18 DIAGNOSIS — J302 Other seasonal allergic rhinitis: Secondary | ICD-10-CM | POA: Diagnosis not present
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
## 2018-07-09 ENCOUNTER — Other Ambulatory Visit: Payer: Self-pay | Admitting: Physician Assistant

## 2018-07-09 NOTE — Telephone Encounter (Signed)
Please call pt for follow up for mediation refills

## 2018-07-10 ENCOUNTER — Ambulatory Visit: Payer: BC Managed Care – PPO | Admitting: Physician Assistant

## 2018-07-24 ENCOUNTER — Encounter: Payer: Self-pay | Admitting: Physician Assistant

## 2018-07-24 ENCOUNTER — Ambulatory Visit (INDEPENDENT_AMBULATORY_CARE_PROVIDER_SITE_OTHER): Payer: BC Managed Care – PPO | Admitting: Physician Assistant

## 2018-07-24 VITALS — BP 122/58 | HR 78 | Temp 98.6°F | Ht 64.0 in | Wt 267.0 lb

## 2018-07-24 DIAGNOSIS — L03011 Cellulitis of right finger: Secondary | ICD-10-CM | POA: Diagnosis not present

## 2018-07-24 DIAGNOSIS — L603 Nail dystrophy: Secondary | ICD-10-CM | POA: Diagnosis not present

## 2018-07-24 DIAGNOSIS — Z131 Encounter for screening for diabetes mellitus: Secondary | ICD-10-CM

## 2018-07-24 DIAGNOSIS — M2550 Pain in unspecified joint: Secondary | ICD-10-CM | POA: Diagnosis not present

## 2018-07-24 DIAGNOSIS — E7800 Pure hypercholesterolemia, unspecified: Secondary | ICD-10-CM | POA: Diagnosis not present

## 2018-07-24 DIAGNOSIS — B352 Tinea manuum: Secondary | ICD-10-CM

## 2018-07-24 MED ORDER — CLOTRIMAZOLE 1 % EX CREA: 1.0000 "application " | TOPICAL_CREAM | Freq: Two times a day (BID) | CUTANEOUS | 0 refills | Status: DC

## 2018-07-24 MED ORDER — DOXYCYCLINE HYCLATE 100 MG PO TABS: 100.0000 mg | ORAL_TABLET | Freq: Two times a day (BID) | ORAL | 0 refills | Status: DC
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
## 2018-07-24 NOTE — Patient Instructions (Addendum)
Paronychia Paronychia is an infection of the skin. It happens near a fingernail or toenail. It may cause pain and swelling around the nail. In some cases, a fluid-filled bump (abscess) can form near or under the nail. Usually, this condition is not serious, and it clears up with treatment. Follow these instructions at home: Wound care  Keep the affected area clean.  Soak the fingers or toes in warm water as told by your doctor. You may be told to do this for 20 minutes, 2-3 times a day.  Keep the area dry when you are not soaking it.  Do not try to drain a fluid-filled bump on your own.  Follow instructions from your doctor about how to take care of the affected area. Make sure you: ? Wash your hands with soap and water before you change your bandage (dressing). If you cannot use soap and water, use hand sanitizer. ? Change your bandage as told by your doctor.  If you had a fluid-filled bump and your doctor drained it, check the area every day for signs of infection. Check for: ? Redness, swelling, or pain. ? Fluid or blood. ? Warmth. ? Pus or a bad smell. Medicines   Take over-the-counter and prescription medicines only as told by your doctor.  If you were prescribed an antibiotic medicine, take it as told by your doctor. Do not stop taking it even if you start to feel better. General instructions  Avoid touching any chemicals.  Do not pick at the affected area. Prevention  To prevent this condition from happening again: ? Wear rubber gloves when putting your hands in water for washing dishes or other tasks. ? Wear gloves if your hands might touch cleaners or chemicals. ? Avoid injuring your nails or fingertips. ? Do not bite your nails or tear hangnails. ? Do not cut your nails very short. ? Do not cut the skin at the base and sides of the nail (cuticles). ? Use clean nail clippers or scissors when trimming nails. Contact a doctor if:  You feel worse.  You do not get  better.  You have more fluid, blood, or pus coming from the affected area.  Your finger or knuckle is swollen or is hard to move. Get help right away if you have:  A fever or chills.  Redness spreading from the affected area.  Pain in a joint or muscle. Summary  Paronychia is an infection of the skin. It happens near a fingernail or toenail.  This condition may cause pain and swelling around the nail.  Soak the fingers or toes in warm water as told by your doctor.  Usually, this condition is not serious, and it clears up with treatment. This information is not intended to replace advice given to you by your health care provider. Make sure you discuss any questions you have with your health care provider. Document Released: 01/23/2009 Document Revised: 02/17/2017 Document Reviewed: 02/17/2017 Elsevier Interactive Patient Education  2019 [employer].  Shoulder Impingement Syndrome Rehab Ask your health care provider which exercises are safe for you. Do exercises exactly as told by your health care provider and adjust them as directed. It is normal to feel mild stretching, pulling, tightness, or discomfort as you do these exercises, but you should stop right away if you feel sudden pain or your pain gets worse.Do not begin these exercises until told by your health care provider. Stretching and range of motion exercise This exercise warms up your muscles and joints  and improves the movement and flexibility of your shoulder. This exercise also helps to relieve pain and stiffness. Exercise A: Passive horizontal adduction  1. Sit or stand and pull your left / right elbow across your chest, toward your other shoulder. Stop when you feel a gentle stretch in the back of your shoulder and upper arm. ? Keep your arm at shoulder height. ? Keep your arm as close to your body as you comfortably can. 2. Hold for __________ seconds. 3. Slowly return to the starting position. Repeat __________  times. Complete this exercise __________ times a day. Strengthening exercises These exercises build strength and endurance in your shoulder. Endurance is the ability to use your muscles for a long time, even after they get tired. Exercise B: External rotation, isometric 1. Stand or sit in a doorway, facing the door frame. 2. Bend your left / right elbow and place the back of your wrist against the door frame. Only your wrist should be touching the frame. Keep your upper arm at your side. 3. Gently press your wrist against the door frame, as if you are trying to push your arm away from your abdomen. ? Avoid shrugging your shoulder while you press your hand against the door frame. Keep your shoulder blade tucked down toward the middle of your back. 4. Hold for __________ seconds. 5. Slowly release the tension, and relax your muscles completely before you do the exercise again. Repeat __________ times. Complete this exercise __________ times a day. Exercise C: Internal rotation, isometric  1. Stand or sit in a doorway, facing the door frame. 2. Bend your left / right elbow and place the inside of your wrist against the door frame. Only your wrist should be touching the frame. Keep your upper arm at your side. 3. Gently press your wrist against the door frame, as if you are trying to push your arm toward your abdomen. ? Avoid shrugging your shoulder while you press your hand against the door frame. Keep your shoulder blade tucked down toward the middle of your back. 4. Hold for __________ seconds. 5. Slowly release the tension, and relax your muscles completely before you do the exercise again. Repeat __________ times. Complete this exercise __________ times a day. Exercise D: Scapular protraction, supine  1. Lie on your back on a firm surface. Hold a __________ weight in your left / right hand. 2. Raise your left / right arm straight into the air so your hand is directly above your shoulder  joint. 3. Push the weight into the air so your shoulder lifts off of the surface that you are lying on. Do not move your head, neck, or back. 4. Hold for __________ seconds. 5. Slowly return to the starting position. Let your muscles relax completely before you repeat this exercise. Repeat __________ times. Complete this exercise __________ times a day. Exercise E: Scapular retraction  1. Sit in a stable chair without armrests, or stand. 2. Secure an exercise band to a stable object in front of you so the band is at shoulder height. 3. Hold one end of the exercise band in each hand. Your palms should face down. 4. Squeeze your shoulder blades together and move your elbows slightly behind you. Do not shrug your shoulders while you do this. 5. Hold for __________ seconds. 6. Slowly return to the starting position. Repeat __________ times. Complete this exercise __________ times a day. Exercise F: Shoulder extension  1. Sit in a stable chair without armrests, or  stand. 2. Secure an exercise band to a stable object in front of you where the band is above shoulder height. 3. Hold one end of the exercise band in each hand. 4. Straighten your elbows and lift your hands up to shoulder height. 5. Squeeze your shoulder blades together and pull your hands down to the sides of your thighs. Stop when your hands are straight down by your sides. Do not let your hands go behind your body. 6. Hold for __________ seconds. 7. Slowly return to the starting position. Repeat __________ times. Complete this exercise __________ times a day. This information is not intended to replace advice given to you by your health care provider. Make sure you discuss any questions you have with your health care provider. Document Released: 02/04/2005 Document Revised: 10/12/2015 Document Reviewed: 01/07/2015 Elsevier Interactive Patient Education  2019 [employer].

## 2018-07-29 ENCOUNTER — Encounter: Payer: Self-pay | Admitting: Physician Assistant

## 2018-07-29 DIAGNOSIS — M2550 Pain in unspecified joint: Secondary | ICD-10-CM | POA: Insufficient documentation

## 2018-07-29 DIAGNOSIS — E7800 Pure hypercholesterolemia, unspecified: Secondary | ICD-10-CM | POA: Insufficient documentation

## 2018-07-29 DIAGNOSIS — L03011 Cellulitis of right finger: Secondary | ICD-10-CM | POA: Insufficient documentation

## 2018-07-29 DIAGNOSIS — L603 Nail dystrophy: Secondary | ICD-10-CM | POA: Insufficient documentation

## 2018-09-23 NOTE — Telephone Encounter (Signed)
Med f/u complete

## 2018-09-25 ENCOUNTER — Telehealth: Payer: Self-pay | Admitting: Neurology

## 2018-09-25 NOTE — Telephone Encounter (Signed)
Received note from [employer] that they have not received sample from [employer]. Called patient to try to encourage to send in her kit. No answer. Phone disconnected.

## 2018-12-25 ENCOUNTER — Ambulatory Visit (INDEPENDENT_AMBULATORY_CARE_PROVIDER_SITE_OTHER): Payer: BC Managed Care – PPO | Admitting: Physician Assistant

## 2018-12-25 ENCOUNTER — Ambulatory Visit (INDEPENDENT_AMBULATORY_CARE_PROVIDER_SITE_OTHER): Payer: BC Managed Care – PPO

## 2018-12-25 ENCOUNTER — Other Ambulatory Visit: Payer: Self-pay | Admitting: Physician Assistant

## 2018-12-25 ENCOUNTER — Other Ambulatory Visit: Payer: Self-pay

## 2018-12-25 ENCOUNTER — Encounter: Payer: Self-pay | Admitting: Physician Assistant

## 2018-12-25 VITALS — BP 142/56 | HR 98 | Ht 64.0 in | Wt 269.0 lb

## 2018-12-25 DIAGNOSIS — B352 Tinea manuum: Secondary | ICD-10-CM | POA: Diagnosis not present

## 2018-12-25 DIAGNOSIS — M25512 Pain in left shoulder: Secondary | ICD-10-CM | POA: Diagnosis not present

## 2018-12-25 DIAGNOSIS — G8929 Other chronic pain: Secondary | ICD-10-CM

## 2018-12-25 DIAGNOSIS — M5441 Lumbago with sciatica, right side: Secondary | ICD-10-CM | POA: Diagnosis not present

## 2018-12-25 DIAGNOSIS — M5136 Other intervertebral disc degeneration, lumbar region: Secondary | ICD-10-CM | POA: Diagnosis not present

## 2018-12-25 DIAGNOSIS — M160 Bilateral primary osteoarthritis of hip: Secondary | ICD-10-CM

## 2018-12-25 DIAGNOSIS — M19012 Primary osteoarthritis, left shoulder: Secondary | ICD-10-CM

## 2018-12-25 DIAGNOSIS — Z1231 Encounter for screening mammogram for malignant neoplasm of breast: Secondary | ICD-10-CM

## 2018-12-25 MED ORDER — KETOROLAC TROMETHAMINE 30 MG/ML IJ SOLN
30.0000 mg | Freq: Once | INTRAMUSCULAR | Status: AC
  Administered 2018-12-25: 09:00:00 30 mg via INTRAMUSCULAR

## 2018-12-25 MED ORDER — CLOTRIMAZOLE 1 % EX CREA: 1.0000 "application " | TOPICAL_CREAM | Freq: Two times a day (BID) | CUTANEOUS | 1 refills | Status: DC

## 2018-12-25 MED ORDER — CYCLOBENZAPRINE HCL 5 MG PO TABS: 5.0000 mg | ORAL_TABLET | Freq: Three times a day (TID) | ORAL | 1 refills | Status: DC | PRN
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
## 2018-12-25 NOTE — Patient Instructions (Addendum)
Shoulder Impingement Syndrome Rehab Ask your health care provider which exercises are safe for you. Do exercises exactly as told by your health care provider and adjust them as directed. It is normal to feel mild stretching, pulling, tightness, or discomfort as you do these exercises. Stop right away if you feel sudden pain or your pain gets worse. Do not begin these exercises until told by your health care provider. Stretching and range-of-motion exercise This exercise warms up your muscles and joints and improves the movement and flexibility of your shoulder. This exercise also helps to relieve pain and stiffness. Passive horizontal adduction In passive adduction, you use your other hand to move the injured arm toward your body. The injured arm does not move on its own. In this movement, your arm is moved across your body in the horizontal plane (horizontal adduction). 1. Sit or stand and pull your left / right elbow across your chest, toward your other shoulder. Stop when you feel a gentle stretch in the back of your shoulder and upper arm. ? Keep your arm at shoulder height. ? Keep your arm as close to your body as you comfortably can. 2. Hold for __________ seconds. 3. Slowly return to the starting position. Repeat __________ times. Complete this exercise __________ times a day. Strengthening exercises These exercises build strength and endurance in your shoulder. Endurance is the ability to use your muscles for a long time, even after they get tired. External rotation, isometric This is an exercise in which you press the back of your wrist against a door frame without moving your shoulder joint (isometric). 1. Stand or sit in a doorway, facing the door frame. 2. Bend your left / right elbow and place the back of your wrist against the door frame. Only the back of your wrist should be touching the frame. Keep your upper arm at your side. 3. Gently press your wrist against the door frame,  as if you are trying to push your arm away from your abdomen (external rotation). Press as hard as you are able without pain. ? Avoid shrugging your shoulder while you press your wrist against the door frame. Keep your shoulder blade tucked down toward the middle of your back. 4. Hold for __________ seconds. 5. Slowly release the tension, and relax your muscles completely before you repeat the exercise. Repeat __________ times. Complete this exercise __________ times a day. Internal rotation, isometric This is an exercise in which you press your palm against a door frame without moving your shoulder joint (isometric). 1. Stand or sit in a doorway, facing the door frame. 2. Bend your left / right elbow and place the palm of your hand against the door frame. Only your palm should be touching the frame. Keep your upper arm at your side. 3. Gently press your hand against the door frame, as if you are trying to push your arm toward your abdomen (internal rotation). Press as hard as you are able without pain. ? Avoid shrugging your shoulder while you press your hand against the door frame. Keep your shoulder blade tucked down toward the middle of your back. 4. Hold for __________ seconds. 5. Slowly release the tension, and relax your muscles completely before you repeat the exercise. Repeat __________ times. Complete this exercise __________ times a day. Scapular protraction, supine  1. Lie on your back on a firm surface (supine position). Hold a __________ weight in your left / right hand. 2. Raise your left / right  arm straight into the air so your hand is directly above your shoulder joint. 3. Push the weight into the air so your shoulder (scapula) lifts off the surface that you are lying on. The scapula will push up or forward (protraction). Do not move your head, neck, or back. 4. Hold for __________ seconds. 5. Slowly return to the starting position. Let your muscles relax completely before you  repeat this exercise. Repeat __________ times. Complete this exercise __________ times a day. Scapular retraction  1. Sit in a stable chair without armrests, or stand up. 2. Secure an exercise band to a stable object in front of you so the band is at shoulder height. 3. Hold one end of the exercise band in each hand. Your palms should face down. 4. Squeeze your shoulder blades together (retraction) and move your elbows slightly behind you. Do not shrug your shoulders upward while you do this. 5. Hold for __________ seconds. 6. Slowly return to the starting position. Repeat __________ times. Complete this exercise __________ times a day. Shoulder extension  1. Sit in a stable chair without armrests, or stand up. 2. Secure an exercise band to a stable object in front of you so the band is above shoulder height. 3. Hold one end of the exercise band in each hand. 4. Straighten your elbows and lift your hands up to shoulder height. 5. Squeeze your shoulder blades together and pull your hands down to the sides of your thighs (extension). Stop when your hands are straight down by your sides. Do not let your hands go behind your body. 6. Hold for __________ seconds. 7. Slowly return to the starting position. Repeat __________ times. Complete this exercise __________ times a day. This information is not intended to replace advice given to you by your health care provider. Make sure you discuss any questions you have with your health care provider. Document Released: 02/04/2005 Document Revised: 05/29/2018 Document Reviewed: 03/02/2018 Elsevier Patient Education  2020 [employer]. Piriformis Syndrome Rehab Ask your health care provider which exercises are safe for you. Do exercises exactly as told by your health care provider and adjust them as directed. It is normal to feel mild stretching, pulling, tightness, or discomfort as you do these exercises. Stop right away if you feel sudden pain or your  pain gets worse. Do not begin these exercises until told by your health care provider. Stretching and range-of-motion exercises These exercises warm up your muscles and joints and improve the movement and flexibility of your hip and pelvis. The exercises also help to relieve pain, numbness, and tingling. Hip rotation This is an exercise in which you lie on your back and stretch the muscles that rotate your hip (hip rotators) to stretch your buttocks. 1. Lie on your back on a firm surface. 2. Pull your left / right knee toward your same shoulder with your left / right hand until your knee is pointing toward the ceiling. Hold your left / right ankle with your other hand. 3. Keeping your knee steady, gently pull your left / right ankle toward your other shoulder until you feel a stretch in your buttocks. 4. Hold this position for __________ seconds. Repeat __________ times. Complete this exercise __________ times a day. Hip extensor This is an exercise in which you lie on your back and pull your knee to your chest. 1. Lie on your back on a firm surface. Both of your legs should be straight. 2. Pull your left / right knee to  your chest. Hold your leg in this position by holding onto the back of your thigh or the front of your knee. 3. Hold this position for __________ seconds. 4. Slowly return to the starting position. Repeat __________ times. Complete this exercise __________ times a day. Strengthening exercises These exercises build strength and endurance in your hip and thigh muscles. Endurance is the ability to use your muscles for a long time, even after they get tired. Straight leg raises, side-lying This exercise strengthens the muscles that rotate the leg at the hip and move it away from your body (hip abductors). 1. Lie on your side with your left / right leg in the top position. Lie so your head, shoulder, knee, and hip line up. Bend your bottom knee to help you balance. 2. Lift your top  leg 4-6 inches (10-15 cm) while keeping your toes pointed straight ahead. 3. Hold this position for __________ seconds. 4. Slowly lower your leg to the starting position. 5. Let your muscles relax completely after each repetition. Repeat __________ times. Complete this exercise __________ times a day. Hip abduction and rotation This is sometimes called quadruped (on hands and knees) exercises. 6. Get on your hands and knees on a firm, lightly padded surface. Your hands should be directly below your shoulders, and your knees should be directly below your hips. 7. Lift your left / right knee out to the side. Keep your knee bent. Do not twist your body. 8. Hold this position for __________ seconds. 9. Slowly lower your leg. Repeat __________ times. Complete this exercise __________ times a day. Straight leg raises, face-down This exercise stretches the muscles that move your hips away from the front of the pelvis (hip extensors). 7. Lie on your abdomen on a bed or a firm surface with a pillow under your hips. 8. Squeeze your buttocks muscles and lift your left / right leg about 4-6 inches (10-15 cm) off the bed. Do not let your back arch. 9. Hold this position for __________ seconds. 10. Slowly lower your leg to the starting position. 11. Let your muscles relax completely after each repetition. Repeat __________ times. Complete this exercise __________ times a day. This information is not intended to replace advice given to you by your health care provider. Make sure you discuss any questions you have with your health care provider. Document Released: 02/04/2005 Document Revised: 05/28/2018 Document Reviewed: 11/27/2017 Elsevier Patient Education  Tobias Band Syndrome Rehab Ask your health care provider which exercises are safe for you. Do exercises exactly as told by your health care provider and adjust them as directed. It is normal to feel mild stretching, pulling,  tightness, or discomfort as you do these exercises. Stop right away if you feel sudden pain or your pain gets significantly worse. Do not begin these exercises until told by your health care provider. Stretching and range-of-motion exercises These exercises warm up your muscles and joints and improve the movement and flexibility of your hip and pelvis. Quadriceps stretch, prone  1. Lie on your abdomen on a firm surface, such as a bed or padded floor (prone position). 2. Bend your left / right knee and reach back to hold your ankle or pant leg. If you cannot reach your ankle or pant leg, loop a belt around your foot and grab the belt instead. 3. Gently pull your heel toward your buttocks. Your knee should not slide out to the side. You should feel a stretch in the  front of your thigh and knee (quadriceps). 4. Hold this position for __________ seconds. Repeat __________ times. Complete this exercise __________ times a day. Iliotibial band stretch An iliotibial band is a strong band of muscle tissue that runs from the outer side of your hip to the outer side of your thigh and knee. 1. Lie on your side with your left / right leg in the top position. 2. Bend both of your knees and grab your left / right ankle. Stretch out your bottom arm to help you balance. 3. Slowly bring your top knee back so your thigh goes behind your trunk. 4. Slowly lower your top leg toward the floor until you feel a gentle stretch on the outside of your left / right hip and thigh. If you do not feel a stretch and your knee will not fall farther, place the heel of your other foot on top of your knee and pull your knee down toward the floor with your foot. 5. Hold this position for __________ seconds. Repeat __________ times. Complete this exercise __________ times a day. Strengthening exercises These exercises build strength and endurance in your hip and pelvis. Endurance is the ability to use your muscles for a long time, even  after they get tired. Straight leg raises, side-lying This exercise strengthens the muscles that rotate the leg at the hip and move it away from your body (hip abductors). 1. Lie on your side with your left / right leg in the top position. Lie so your head, shoulder, hip, and knee line up. You may bend your bottom knee to help you balance. 2. Roll your hips slightly forward so your hips are stacked directly over each other and your left / right knee is facing forward. 3. Tense the muscles in your outer thigh and lift your top leg 4-6 inches (10-15 cm). 4. Hold this position for __________ seconds. 5. Slowly return to the starting position. Let your muscles relax completely before doing another repetition. Repeat __________ times. Complete this exercise __________ times a day. Leg raises, prone This exercise strengthens the muscles that move the hips (hip extensors). 1. Lie on your abdomen on your bed or a firm surface. You can put a pillow under your hips if that is more comfortable for your lower back. 2. Bend your left / right knee so your foot is straight up in the air. 3. Squeeze your buttocks muscles and lift your left / right thigh off the bed. Do not let your back arch. 4. Tense your thigh muscle as hard as you can without increasing any knee pain. 5. Hold this position for __________ seconds. 6. Slowly lower your leg to the starting position and allow it to relax completely. Repeat __________ times. Complete this exercise __________ times a day. Hip hike 1. Stand sideways on a bottom step. Stand on your left / right leg with your other foot unsupported next to the step. You can hold on to the railing or wall for balance if needed. 2. Keep your knees straight and your torso square. Then lift your left / right hip up toward the ceiling. 3. Slowly let your left / right hip lower toward the floor, past the starting position. Your foot should get closer to the floor. Do not lean or bend your  knees. Repeat __________ times. Complete this exercise __________ times a day. This information is not intended to replace advice given to you by your health care provider. Make sure you discuss any questions  you have with your health care provider. Document Released: 02/04/2005 Document Revised: 05/28/2018 Document Reviewed: 11/26/2017 Elsevier Patient Education  2020 [employer].

## 2018-12-27 ENCOUNTER — Encounter: Payer: Self-pay | Admitting: Physician Assistant

## 2018-12-28 ENCOUNTER — Encounter: Payer: Self-pay | Admitting: Physician Assistant

## 2018-12-28 NOTE — Progress Notes (Signed)
Alicia White,   Mild hip arthritis.   I do think you could benefit from othropedic specialist to manage hip/lumbar/shoulder arthritis and chronic left shoulder pain. Ok to make referral?   -[name]

## 2018-12-28 NOTE — Progress Notes (Signed)
Alicia White,   Xray shows arthritis, some mild displacement of shoulder and loss of joint space. Suspicious of a rotator cuff tear. Next step is MRI of shoulder. Ok to order?

## 2018-12-28 NOTE — Progress Notes (Signed)
Alicia White,   Lots of arthritis over spine with disc space loss. If low back symptoms continue need to consider lumbar MRI as well.   [name]

## 2018-12-29 MED ORDER — TRAMADOL HCL 50 MG PO TABS: 50.0000 mg | ORAL_TABLET | Freq: Two times a day (BID) | ORAL | 0 refills | Status: DC | PRN

## 2018-12-29 MED ORDER — PREDNISONE 20 MG PO TABS: ORAL_TABLET | ORAL | 0 refills | Status: DC

## 2018-12-30 ENCOUNTER — Encounter: Payer: Self-pay | Admitting: Physician Assistant

## 2018-12-30 DIAGNOSIS — B352 Tinea manuum: Secondary | ICD-10-CM | POA: Insufficient documentation

## 2018-12-30 NOTE — Telephone Encounter (Signed)
Can we get her handicap sticker?

## 2019-01-13 ENCOUNTER — Ambulatory Visit (INDEPENDENT_AMBULATORY_CARE_PROVIDER_SITE_OTHER): Payer: BC Managed Care – PPO

## 2019-01-13 ENCOUNTER — Other Ambulatory Visit: Payer: Self-pay

## 2019-01-13 DIAGNOSIS — Z1231 Encounter for screening mammogram for malignant neoplasm of breast: Secondary | ICD-10-CM | POA: Diagnosis not present

## 2019-01-15 ENCOUNTER — Other Ambulatory Visit: Payer: Self-pay | Admitting: Physician Assistant

## 2019-01-15 DIAGNOSIS — M5441 Lumbago with sciatica, right side: Secondary | ICD-10-CM

## 2019-01-17 NOTE — Progress Notes (Signed)
Normal mammogram. Follow up in 1 year.

## 2019-01-21 ENCOUNTER — Ambulatory Visit: Payer: BC Managed Care – PPO | Admitting: Family Medicine

## 2019-01-29 ENCOUNTER — Other Ambulatory Visit: Payer: Self-pay | Admitting: Physician Assistant

## 2019-01-29 DIAGNOSIS — M25512 Pain in left shoulder: Secondary | ICD-10-CM

## 2019-01-29 DIAGNOSIS — M5441 Lumbago with sciatica, right side: Secondary | ICD-10-CM

## 2019-01-29 DIAGNOSIS — G8929 Other chronic pain: Secondary | ICD-10-CM

## 2019-01-29 NOTE — Telephone Encounter (Signed)
Last refill 11/27? How often is she using this?

## 2019-02-01 NOTE — Telephone Encounter (Signed)
Spoke with patient. She states she is taking twice daily as instructed. She does not see Orthopedic until 02/16/2019. She states [facility] would only fill 7 days at a time.  I called to confirm with Jule Ser. They filled 7 days on 12/29/2018, 7 days on 01/04/2019, and the remaining 16 days on 01/15/2019. She is due for refill at this time.

## 2019-02-02 NOTE — Telephone Encounter (Signed)
PAtient made aware.

## 2019-02-16 ENCOUNTER — Ambulatory Visit: Payer: BC Managed Care – PPO | Admitting: Family Medicine

## 2019-02-17 ENCOUNTER — Other Ambulatory Visit: Payer: Self-pay | Admitting: Physician Assistant

## 2019-02-17 DIAGNOSIS — M5441 Lumbago with sciatica, right side: Secondary | ICD-10-CM

## 2019-02-17 NOTE — Telephone Encounter (Signed)
She should not need refill she has one right?

## 2019-02-17 NOTE — Telephone Encounter (Signed)
Last filled 01/18/2019 #30 with one refill. Please advise.

## 2019-02-18 NOTE — Telephone Encounter (Signed)
Written for #60 but up to 3 times daily. She does have visit with orthopedic at the beginning of February.

## 2019-03-22 ENCOUNTER — Ambulatory Visit: Payer: BC Managed Care – PPO | Admitting: Family Medicine

## 2019-03-23 ENCOUNTER — Encounter: Payer: Self-pay | Admitting: Family Medicine

## 2019-03-23 ENCOUNTER — Other Ambulatory Visit: Payer: Self-pay

## 2019-03-23 ENCOUNTER — Ambulatory Visit: Payer: BC Managed Care – PPO | Admitting: Family Medicine

## 2019-03-23 DIAGNOSIS — M25512 Pain in left shoulder: Secondary | ICD-10-CM | POA: Diagnosis not present

## 2019-03-23 DIAGNOSIS — M5441 Lumbago with sciatica, right side: Secondary | ICD-10-CM | POA: Diagnosis not present

## 2019-03-23 DIAGNOSIS — G8929 Other chronic pain: Secondary | ICD-10-CM

## 2019-03-23 DIAGNOSIS — M545 Low back pain, unspecified: Secondary | ICD-10-CM

## 2019-03-23 DIAGNOSIS — R200 Anesthesia of skin: Secondary | ICD-10-CM | POA: Diagnosis not present

## 2019-03-23 MED ORDER — CYCLOBENZAPRINE HCL 5 MG PO TABS: 5.0000 mg | ORAL_TABLET | Freq: Three times a day (TID) | ORAL | 3 refills | Status: DC | PRN

## 2019-03-23 MED ORDER — TRAMADOL HCL 50 MG PO TABS: 50.0000 mg | ORAL_TABLET | Freq: Four times a day (QID) | ORAL | 0 refills | Status: DC | PRN

## 2019-03-23 NOTE — Progress Notes (Signed)
I saw and examined the patient with Dr. Mayer Masker and agree with assessment and plan as outlined.    Chronic left shoulder pain, possible labrum tear and partial supraspinatus tear.  Would prefer to try PT rather than MRI and surgical consult.    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  Not improving with bracing.  Will order nerve studies, surgical consult if indicated.  Chronic LBP with DDD on x-ray.  Non-focal neuro exam.  Will try PT.  Continue flexeril, tramadol as needed.
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
## 2019-04-01 ENCOUNTER — Ambulatory Visit (INDEPENDENT_AMBULATORY_CARE_PROVIDER_SITE_OTHER): Payer: BC Managed Care – PPO | Admitting: Rehabilitative and Restorative Service Providers"

## 2019-04-01 ENCOUNTER — Other Ambulatory Visit: Payer: Self-pay

## 2019-04-01 DIAGNOSIS — M25512 Pain in left shoulder: Secondary | ICD-10-CM | POA: Diagnosis not present

## 2019-04-01 DIAGNOSIS — M6281 Muscle weakness (generalized): Secondary | ICD-10-CM | POA: Diagnosis not present

## 2019-04-01 DIAGNOSIS — M5440 Lumbago with sciatica, unspecified side: Secondary | ICD-10-CM

## 2019-04-01 DIAGNOSIS — R293 Abnormal posture: Secondary | ICD-10-CM | POA: Diagnosis not present

## 2019-04-01 DIAGNOSIS — R29898 Other symptoms and signs involving the musculoskeletal system: Secondary | ICD-10-CM

## 2019-04-01 DIAGNOSIS — G8929 Other chronic pain: Secondary | ICD-10-CM
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
## 2019-04-01 NOTE — Patient Instructions (Signed)
Access Code: [email or web address]  URL: [email or web address]  Date: 04/01/2019  Prepared by: Rudell Cobb   Exercises Supine Lower Trunk Rotation - 10 reps - 1 sets - 2x daily - 7x weekly Seated Table Piriformis Stretch - 3 reps - 1 sets - 30 seconds hold - 2x daily - 7x weekly Standing Shoulder and Trunk Flexion at Table - 10 reps - 1 sets - 2x daily - 7x weekly Standing Backward Shoulder Rolls - 10 reps - 1 sets - 2x daily - 7x weekly Standing Scapular Retraction - 10 reps - 1 sets - 2x daily - 7x weekly

## 2019-04-08 ENCOUNTER — Encounter: Payer: BC Managed Care – PPO | Admitting: Rehabilitative and Restorative Service Providers"

## 2019-04-09 ENCOUNTER — Encounter: Payer: BC Managed Care – PPO | Admitting: Physical Medicine and Rehabilitation

## 2019-04-10 ENCOUNTER — Other Ambulatory Visit: Payer: Self-pay | Admitting: Physician Assistant

## 2019-04-10 DIAGNOSIS — M545 Low back pain, unspecified: Secondary | ICD-10-CM

## 2019-04-10 DIAGNOSIS — G8929 Other chronic pain: Secondary | ICD-10-CM

## 2019-04-15 ENCOUNTER — Encounter: Payer: Self-pay | Admitting: Rehabilitative and Restorative Service Providers"

## 2019-04-15 ENCOUNTER — Other Ambulatory Visit: Payer: Self-pay

## 2019-04-15 ENCOUNTER — Ambulatory Visit: Payer: BC Managed Care – PPO | Admitting: Rehabilitative and Restorative Service Providers"

## 2019-04-15 DIAGNOSIS — R29898 Other symptoms and signs involving the musculoskeletal system: Secondary | ICD-10-CM

## 2019-04-15 DIAGNOSIS — M5440 Lumbago with sciatica, unspecified side: Secondary | ICD-10-CM | POA: Diagnosis not present

## 2019-04-15 DIAGNOSIS — M25512 Pain in left shoulder: Secondary | ICD-10-CM | POA: Diagnosis not present

## 2019-04-15 DIAGNOSIS — G8929 Other chronic pain: Secondary | ICD-10-CM

## 2019-04-15 DIAGNOSIS — R293 Abnormal posture: Secondary | ICD-10-CM

## 2019-04-15 DIAGNOSIS — M6281 Muscle weakness (generalized): Secondary | ICD-10-CM
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
## 2019-04-15 NOTE — Patient Instructions (Signed)
Access Code: [email or web address]  URL: [email or web address]  Date: 04/15/2019  Prepared by: Rudell Cobb   Exercises Supine Lower Trunk Rotation - 10 reps - 1 sets - 2x daily - 7x weekly Seated Table Piriformis Stretch - 3 reps - 1 sets - 30 seconds hold - 2x daily - 7x weekly Standing Shoulder and Trunk Flexion at Table - 10 reps - 1 sets - 2x daily - 7x weekly Standing Backward Shoulder Rolls - 10 reps - 1 sets - 2x daily - 7x weekly Standing Scapular Retraction - 10 reps - 1 sets - 2x daily - 7x weekly Standing Shoulder External Rotation Stretch in Doorway - 3 reps - 1 sets - 20 seconds hold - 2x daily - 7x weekly

## 2019-04-18 ENCOUNTER — Other Ambulatory Visit: Payer: Self-pay | Admitting: Physician Assistant

## 2019-04-19 ENCOUNTER — Other Ambulatory Visit: Payer: Self-pay | Admitting: Sports Medicine

## 2019-04-19 DIAGNOSIS — G8929 Other chronic pain: Secondary | ICD-10-CM

## 2019-04-21 ENCOUNTER — Other Ambulatory Visit: Payer: Self-pay

## 2019-04-21 ENCOUNTER — Ambulatory Visit (INDEPENDENT_AMBULATORY_CARE_PROVIDER_SITE_OTHER): Payer: BC Managed Care – PPO | Admitting: Physical Medicine and Rehabilitation

## 2019-04-21 DIAGNOSIS — R202 Paresthesia of skin: Secondary | ICD-10-CM

## 2019-04-21 NOTE — Progress Notes (Signed)
  Numeric Pain Rating Scale and Functional Assessment Average Pain 8   In the last MONTH (on 0-10 scale) has pain interfered with the following?  1. General activity like being  able to carry out your everyday physical activities such as walking, climbing stairs, carrying groceries, or moving a chair?  Rating(9)   .  

## 2019-04-22 NOTE — Procedures (Signed)
EMG & NCV Findings: Evaluation of the left median motor and the right median motor nerves showed prolonged distal onset latency (L7.4, R8.1 ms), reduced amplitude (L4.2, R2.9 mV), and decreased conduction velocity (Elbow-Wrist, L45, R42 m/s).  The left median (across palm) sensory and the right median (across palm) sensory nerves showed prolonged distal peak latency (Wrist, L5.7, R5.0 ms) and prolonged distal peak latency (Palm, L2.7, R2.1 ms).  All remaining nerves (as indicated in the following tables) were within normal limits.  Left vs. Right side comparison data for the ulnar motor nerve indicates abnormal L-R velocity difference (A Elbow-B Elbow, 44 m/s).  All remaining left vs. right side differences were within normal limits.    Needle EMG evaluation of the right APB showed increased insertional activity, reduced recruitment and moderate denervation potentials. All other examined muscles (as indicated in the following table) showed no evidence of electrical instability.    Impression: The above electrodiagnostic study is ABNORMAL and reveals evidence of a severe BILATERAL median nerve entrapment at the wrist (carpal tunnel syndrome) affecting sensory and motor components.   There is no significant electrodiagnostic evidence of any other focal nerve entrapment or generalized peripheral neuropathy.   Recommendations: 1.  Follow-up with referring physician. 2.  Continue current management of symptoms. 3.  Suggest surgical evaluation.  ___________________________ Laurence Spates FAAPMR Board Certified, American Board of Physical Medicine and Rehabilitation    Nerve Conduction Studies Anti Sensory Summary Table   Stim Site NR Peak (ms) Norm Peak (ms) P-T Amp (V) Norm P-T Amp Site1 Site2 Delta-P (ms) Dist (cm) Vel (m/s) Norm Vel (m/s)  Left Median Acr Palm Anti Sensory (2nd Digit)  33.2C  Wrist    *5.7 <3.6 10.3 >10 Wrist Palm 3.0 0.0    Palm    *2.7 <2.0 6.8         Right Median Acr Palm  Anti Sensory (2nd Digit)  32.3C  Wrist    *5.0 <3.6 20.1 >10 Wrist Palm 2.9 0.0    Palm    *2.1 <2.0 18.1         Left Radial Anti Sensory (Base 1st Digit)  32.9C  Wrist    2.2 <3.1 16.9  Wrist Base 1st Digit 2.2 0.0    Right Radial Anti Sensory (Base 1st Digit)  32.6C  Wrist    2.2 <3.1 22.2  Wrist Base 1st Digit 2.2 0.0    Left Ulnar Anti Sensory (5th Digit)  33.2C  Wrist    3.0 <3.7 28.3 >15.0 Wrist 5th Digit 3.0 14.0 47 >38  Right Ulnar Anti Sensory (5th Digit)  32.7C  Wrist    2.9 <3.7 15.5 >15.0 Wrist 5th Digit 2.9 14.0 48 >38   Motor Summary Table   Stim Site NR Onset (ms) Norm Onset (ms) O-P Amp (mV) Norm O-P Amp Site1 Site2 Delta-0 (ms) Dist (cm) Vel (m/s) Norm Vel (m/s)  Left Median Motor (Abd Poll Brev)  32.9C  Wrist    *7.4 <4.2 *4.2 >5 Elbow Wrist 4.7 21.0 *45 >50  Elbow    12.1  3.6         Right Median Motor (Abd Poll Brev)  32.7C  Wrist    *8.1 <4.2 *2.9 >5 Elbow Wrist 5.0 21.2 *42 >50  Elbow    13.1  2.6         Left Ulnar Motor (Abd Dig Min)  32.7C  Wrist    2.5 <4.2 10.8 >3 B Elbow Wrist 2.9 19.5 67 >53  B Elbow  5.4  9.0  A Elbow B Elbow 1.8 10.0 56 >53  A Elbow    7.2  8.6         Right Ulnar Motor (Abd Dig Min)  32.9C  Wrist    2.7 <4.2 10.4 >3 B Elbow Wrist 3.2 20.0 63 >53  B Elbow    5.9  10.9  A Elbow B Elbow 1.0 10.0 100 >53  A Elbow    6.9  11.0          EMG   Side Muscle Nerve Root Ins Act Fibs Psw Amp Dur Poly Recrt Int Fraser Din Comment  Right Abd Poll Brev Median C8-T1 Inc 3+ 3+ Nml Nml 0 Rdc Nml   Right 1stDorInt Ulnar C8-T1 Nml Nml Nml Nml Nml 0 Nml Nml   Right PronatorTeres Median C6-7 Nml Nml Nml Nml Nml 0 Nml Nml   Right Biceps Musculocut C5-6 Nml Nml Nml Nml Nml 0 Nml Nml   Right Deltoid Axillary C5-6 Nml Nml Nml Nml Nml 0 Nml Nml     Nerve Conduction Studies Anti Sensory Left/Right Comparison   Stim Site L Lat (ms) R Lat (ms) L-R Lat (ms) L Amp (V) R Amp (V) L-R Amp (%) Site1 Site2 L Vel (m/s) R Vel (m/s) L-R Vel (m/s)  Median  Acr Palm Anti Sensory (2nd Digit)  33.2C  Wrist *5.7 *5.0 0.7 10.3 20.1 48.8 Wrist Palm     Palm *2.7 *2.1 0.6 6.8 18.1 62.4       Radial Anti Sensory (Base 1st Digit)  32.9C  Wrist 2.2 2.2 0.0 16.9 22.2 23.9 Wrist Base 1st Digit     Ulnar Anti Sensory (5th Digit)  33.2C  Wrist 3.0 2.9 0.1 28.3 15.5 45.2 Wrist 5th Digit 47 48 1   Motor Left/Right Comparison   Stim Site L Lat (ms) R Lat (ms) L-R Lat (ms) L Amp (mV) R Amp (mV) L-R Amp (%) Site1 Site2 L Vel (m/s) R Vel (m/s) L-R Vel (m/s)  Median Motor (Abd Poll Brev)  32.9C  Wrist *7.4 *8.1 0.7 *4.2 *2.9 31.0 Elbow Wrist *45 *42 3  Elbow 12.1 13.1 1.0 3.6 2.6 27.8       Ulnar Motor (Abd Dig Min)  32.7C  Wrist 2.5 2.7 0.2 10.8 10.4 3.7 B Elbow Wrist 67 63 4  B Elbow 5.4 5.9 0.5 9.0 10.9 17.4 A Elbow B Elbow 56 100 *44  A Elbow 7.2 6.9 0.3 8.6 11.0 21.8          Waveforms:
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
## 2019-04-23 LAB — COLOGUARD
COLOGUARD: NEGATIVE
Cologuard: NEGATIVE

## 2019-04-23 LAB — EXTERNAL GENERIC LAB PROCEDURE: COLOGUARD: NEGATIVE

## 2019-04-28 ENCOUNTER — Telehealth: Payer: Self-pay | Admitting: Neurology

## 2019-04-28 NOTE — Telephone Encounter (Signed)
Cologuard negative. Patient made aware. Results abstracted.

## 2019-04-29 ENCOUNTER — Ambulatory Visit (INDEPENDENT_AMBULATORY_CARE_PROVIDER_SITE_OTHER): Payer: BC Managed Care – PPO | Admitting: Rehabilitative and Restorative Service Providers"

## 2019-04-29 ENCOUNTER — Encounter: Payer: Self-pay | Admitting: Rehabilitative and Restorative Service Providers"

## 2019-04-29 ENCOUNTER — Other Ambulatory Visit: Payer: Self-pay

## 2019-04-29 ENCOUNTER — Other Ambulatory Visit: Payer: Self-pay | Admitting: Family Medicine

## 2019-04-29 DIAGNOSIS — R293 Abnormal posture: Secondary | ICD-10-CM | POA: Diagnosis not present

## 2019-04-29 DIAGNOSIS — M6281 Muscle weakness (generalized): Secondary | ICD-10-CM | POA: Diagnosis not present

## 2019-04-29 DIAGNOSIS — M25512 Pain in left shoulder: Secondary | ICD-10-CM | POA: Diagnosis not present

## 2019-04-29 NOTE — Patient Instructions (Signed)
Access Code: [email or web address]  URL: [email or web address]  Date: 04/29/2019  Prepared by: Rudell Cobb   Exercises Supine Lower Trunk Rotation - 10 reps - 1 sets - 2x daily - 7x weekly Seated Table Piriformis Stretch - 3 reps - 1 sets - 30 seconds hold - 2x daily - 7x weekly Standing Shoulder and Trunk Flexion at Table - 10 reps - 1 sets - 2x daily - 7x weekly Standing Backward Shoulder Rolls - 10 reps - 1 sets - 2x daily - 7x weekly Standing Scapular Retraction - 10 reps - 1 sets - 2x daily - 7x weekly Standing Shoulder External Rotation Stretch in Doorway - 3 reps - 1 sets - 20 seconds hold - 2x daily - 7x weekly Standing Isometric Shoulder Flexion with Doorway - 5 reps - 1 sets - 3-5 seconds hold - 2x daily - 7x weekly Isometric Shoulder External Rotation with Ball at [employer] - 5 reps - 1 sets - 3-5 seconds hold - 2x daily - 7x weekly Standing Bilateral Low Shoulder Row with Anchored Resistance - 10 reps - 1 sets - 2x daily - 7x weekly

## 2019-04-29 NOTE — Therapy (Addendum)
[facility] [place], [place], [address] Phone: [phone]   Fax:  [phone]  Physical Therapy Treatment and Discharge summary  Patient Details  Name: Alicia White MRN: [id number] Date of Birth: 04-21-1960 Referring Provider (PT): Eunice Blase, MD   Encounter Date: 04/29/2019  PT End of Session - 04/29/19 1820    Visit Number  3    Number of Visits  12    Date for PT Re-Evaluation  05/13/19    Authorization Type  [employer] state program ($52 copay limiting frequency per patient request)    PT Start Time  1528    PT Stop Time  1620    PT Time Calculation (min)  52 min       Past Medical History:  Diagnosis Date  . Asthma   . Endometrial carcinoma ([place])   . Fibroids   . Heart murmur   . Ovarian cyst   . Pneumonia     Past Surgical History:  Procedure Laterality Date  . ABDOMINAL HYSTERECTOMY  07/01/12  . DENTAL SURGERY      There were no vitals filed for this visit.  Subjective Assessment - 04/29/19 1530    Subjective  The patient reports a popping sensation in her left shoulder.  She sometimes has pain with popping and sometimes not.  She has been able to unload the dishwasher a little easier.  She is scheduled to see surgeon for the wrist next week.   Her foot is improving.  Low back pain comes and goes.    Patient Stated Goals  SHOULDER:  reach to pick items up, reach her head to wash her hair   BACK:  get rid of pain.    Currently in Pain?  Yes    Pain Score  2    "not bad right now"   Pain Onset  More than a month ago    Pain Frequency  Intermittent    Pain Score  2    Pain Location  Shoulder    Pain Orientation  Left    Pain Descriptors / Indicators  Aching    Pain Onset  More than a month ago    Pain Frequency  Intermittent    Aggravating Factors   reaching    Pain Relieving Factors  meds         [facility] PT Assessment - 04/29/19 1535      Assessment   Medical Diagnosis  Chronic  right sided low back pain without sciatica; chronic left shoulder pain    Referring Provider (PT)  Eunice Blase, MD      AROM   Overall AROM   Deficits    Overall AROM Comments  Korin can lift into flexion and abduction, but has pain/weakness when lowering extremity    Left Shoulder Flexion  132 Degrees    Left Shoulder ABduction  140 Degrees    Left Shoulder Internal Rotation  45 Degrees    Left Shoulder External Rotation  22 Degrees                   [facility] Adult PT Treatment/Exercise - 04/29/19 1822      Exercises   Exercises  Shoulder      Shoulder Exercises: Supine   External Rotation  PROM;AROM;Strengthening;Left;10 reps    External Rotation Limitations  performed isometrics x 5 reps with 3 second holds and IR x 3 reps     Internal Rotation  AROM;Left;10 reps  Flexion  AROM;Left    Flexion Limitations  also performed scaption plane x 10 reps with AAROM     Other Supine Exercises  chest press x 5 reps without weight      Shoulder Exercises: Standing   ABduction  AROM;Both;5 reps    ABduction Limitations  performed in short arm abduction, extended arm abduction (painful), and then in scaption plane    Row  Strengthening;Both;10 reps    Theraband Level (Shoulder Row)  Level 2 (Red)    Other Standing Exercises  shoulder rolls between exercises      Shoulder Exercises: Isometric Strengthening   Flexion  5X5"    Flexion Limitations  initially with elbow flexed to 90 degrees, but this was painful in her hand    Extension  3X5"    Extension Limitations  patient notes not feeling work (progressed to standing row)    External Rotation  5X5"    ABduction  3X3"    ABduction Limitations  unable to tolerate      Manual Therapy   Manual Therapy  Soft tissue mobilization;Joint mobilization    Manual therapy comments  for pain reduction    Joint Mobilization  supine gentle distraction to reduce pain after STM and IASTM    Soft tissue mobilization  supine position with  IASTM and STM L biceps, middle deltoid,             PT Education - 04/29/19 1819    Education Details  HEP    Person(s) Educated  Patient    Methods  Explanation;Demonstration;Handout    Comprehension  Verbalized understanding;Returned demonstration          PT Long Term Goals - 04/01/19 1809      PT LONG TERM GOAL #1   Title  The patient will be indep with HEP.    Time  6    Period  Weeks    Target Date  05/13/19      PT LONG TERM GOAL #2   Title  The patient will reduce functional limitaiton per FOTO from 43% to < or equal to 33%.    Time  6    Period  Weeks    Target Date  05/13/19      PT LONG TERM GOAL #3   Title  The patient will improve L shoulder AROM to 150 degrees flexion and abduction, and 30 degrees ER.    Baseline  118 deg flexion, 110 deg abduction and 12 degrees ER    Time  6    Period  Weeks    Target Date  05/13/19      PT LONG TERM GOAL #4   Title  The patient will reduce low back pain to < or equal to 4/10 without use of pain meds.    Time  6    Period  Weeks    Target Date  05/13/19      PT LONG TERM GOAL #5   Title  The patient will be able to reach top of head with L UE to wash hair without use of R UE to lift it.    Time  6    Period  Weeks    Target Date  05/13/19            Plan - 04/29/19 1830    Clinical Impression Statement  The patient was able to tolerate the addition of isometrics and rows to HEP (modified hand position due to wrist and hand  pain).  The patient has made gains in AROM against gravity.  She declines dry needling to help reduce trigger points due to dislike of needles.  She is consulting with surgeon next week regarding bilateral wrist/hand pain and nerve compression.  PT to progress to patient tolerance.    Personal Factors and Comorbidities  Comorbidity 1;Comorbidity 2    Comorbidities  arthritis, obesity    Examination-Activity Limitations  Lift;Squat;Locomotion Level;Bend    Examination-Participation  Restrictions  Community Activity    Stability/Clinical Decision Making  Stable/Uncomplicated    Rehab Potential  Good    PT Frequency  2x / week   however patient choosing 1x/week due to high copay   PT Duration  6 weeks    PT Treatment/Interventions  ADLs/Self Care Home Management;Iontophoresis 53m/ml Dexamethasone;Cryotherapy;Electrical Stimulation;Moist Heat;Traction;Therapeutic exercise;Therapeutic activities;Gait training;Patient/family education;Taping;Dry needling;Manual techniques    PT Next Visit Plan  SHOULDER:  check HEP strengthening additions, STM middle deltoid, scapular stability, AAROM supine with cane for HEP.  BACK:  IT Band stretching/STM, R SI opening/stretching, lumbar stabilization, flexion stretching.    PT Home Exercise Plan  Access Code: [email or web address]   Consulted and Agree with Plan of Care  Patient       Patient will benefit from skilled therapeutic intervention in order to improve the following deficits and impairments:  Decreased range of motion, Pain, Postural dysfunction, Impaired sensation, Decreased strength, Hypomobility, Impaired flexibility, Decreased activity tolerance  Visit Diagnosis: Acute pain of left shoulder  Muscle weakness (generalized)  Abnormal posture    PHYSICAL THERAPY DISCHARGE SUMMARY  Visits from Start of Care: 3  Current functional level related to goals / functional outcomes: See goals above.   Remaining deficits: See above for last known patient status as she did not return for further therapy.   Education / Equipment: Home program.  Plan: Patient agrees to discharge.  Patient goals were not met. Patient is being discharged due to not returning since the last visit.  ?????         Thank you for the referral of this patient. CRudell Cobb MPT  WMason PT 04/29/2019, 6:32 PM  [facility] [address]Phone:  [phone]  Fax:  [phone] Name: Alicia SpiraMRN: [id number] of Birth: [birth year]

## 2019-05-05 ENCOUNTER — Ambulatory Visit: Payer: BC Managed Care – PPO | Admitting: Family Medicine

## 2019-05-13 ENCOUNTER — Encounter: Payer: BC Managed Care – PPO | Admitting: Rehabilitative and Restorative Service Providers"

## 2019-05-20 ENCOUNTER — Encounter: Payer: Self-pay | Admitting: Family Medicine

## 2019-05-20 ENCOUNTER — Ambulatory Visit: Payer: BC Managed Care – PPO | Admitting: Family Medicine

## 2019-05-20 ENCOUNTER — Other Ambulatory Visit: Payer: Self-pay

## 2019-05-20 DIAGNOSIS — G5603 Carpal tunnel syndrome, bilateral upper limbs: Secondary | ICD-10-CM | POA: Diagnosis not present
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
## 2019-05-24 ENCOUNTER — Encounter: Payer: Self-pay | Admitting: Family Medicine

## 2019-05-26 ENCOUNTER — Other Ambulatory Visit: Payer: Self-pay | Admitting: Physician Assistant

## 2019-05-26 ENCOUNTER — Other Ambulatory Visit: Payer: Self-pay | Admitting: Family Medicine

## 2019-05-28 ENCOUNTER — Encounter: Payer: Self-pay | Admitting: Orthopedic Surgery

## 2019-05-28 ENCOUNTER — Other Ambulatory Visit: Payer: Self-pay

## 2019-05-28 ENCOUNTER — Ambulatory Visit (INDEPENDENT_AMBULATORY_CARE_PROVIDER_SITE_OTHER): Payer: BC Managed Care – PPO | Admitting: Orthopedic Surgery

## 2019-05-28 DIAGNOSIS — G5603 Carpal tunnel syndrome, bilateral upper limbs: Secondary | ICD-10-CM | POA: Diagnosis not present
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
## 2019-05-31 ENCOUNTER — Other Ambulatory Visit: Payer: Self-pay | Admitting: Physician Assistant

## 2019-06-08 ENCOUNTER — Encounter: Payer: Self-pay | Admitting: Family Medicine

## 2019-06-14 ENCOUNTER — Other Ambulatory Visit: Payer: Self-pay | Admitting: Surgical

## 2019-06-14 ENCOUNTER — Encounter: Payer: Self-pay | Admitting: Orthopedic Surgery

## 2019-06-14 DIAGNOSIS — G5601 Carpal tunnel syndrome, right upper limb: Secondary | ICD-10-CM

## 2019-06-14 MED ORDER — METHOCARBAMOL 500 MG PO TABS: 500.0000 mg | ORAL_TABLET | Freq: Three times a day (TID) | ORAL | 0 refills | Status: DC | PRN

## 2019-06-14 MED ORDER — OXYCODONE HCL 5 MG PO TABS: 5.0000 mg | ORAL_TABLET | ORAL | 0 refills | Status: DC | PRN

## 2019-06-23 ENCOUNTER — Other Ambulatory Visit: Payer: Self-pay

## 2019-06-23 ENCOUNTER — Ambulatory Visit (INDEPENDENT_AMBULATORY_CARE_PROVIDER_SITE_OTHER): Payer: BC Managed Care – PPO | Admitting: Orthopedic Surgery

## 2019-06-23 ENCOUNTER — Encounter: Payer: Self-pay | Admitting: Orthopedic Surgery

## 2019-06-23 DIAGNOSIS — G5603 Carpal tunnel syndrome, bilateral upper limbs: Secondary | ICD-10-CM

## 2019-06-27 ENCOUNTER — Encounter: Payer: Self-pay | Admitting: Orthopedic Surgery
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
## 2019-07-02 ENCOUNTER — Encounter: Payer: Self-pay | Admitting: Physician Assistant

## 2019-07-05 ENCOUNTER — Ambulatory Visit (INDEPENDENT_AMBULATORY_CARE_PROVIDER_SITE_OTHER): Payer: BC Managed Care – PPO | Admitting: Orthopedic Surgery

## 2019-07-05 ENCOUNTER — Other Ambulatory Visit: Payer: Self-pay

## 2019-07-05 DIAGNOSIS — G5603 Carpal tunnel syndrome, bilateral upper limbs: Secondary | ICD-10-CM

## 2019-07-05 NOTE — Telephone Encounter (Signed)
Routing to provider  

## 2019-07-10 ENCOUNTER — Encounter: Payer: Self-pay | Admitting: Orthopedic Surgery
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
## 2019-07-15 ENCOUNTER — Inpatient Hospital Stay: Payer: BC Managed Care – PPO | Admitting: Orthopedic Surgery

## 2019-07-17 ENCOUNTER — Other Ambulatory Visit: Payer: Self-pay | Admitting: Family Medicine

## 2019-07-17 ENCOUNTER — Other Ambulatory Visit: Payer: Self-pay | Admitting: Surgical

## 2019-07-27 ENCOUNTER — Encounter: Payer: Self-pay | Admitting: Orthopedic Surgery

## 2019-08-13 ENCOUNTER — Ambulatory Visit (INDEPENDENT_AMBULATORY_CARE_PROVIDER_SITE_OTHER): Payer: BC Managed Care – PPO | Admitting: Physician Assistant

## 2019-08-13 ENCOUNTER — Other Ambulatory Visit: Payer: Self-pay

## 2019-08-13 ENCOUNTER — Encounter: Payer: Self-pay | Admitting: Physician Assistant

## 2019-08-13 VITALS — BP 122/76 | HR 90 | Ht 64.0 in | Wt 291.0 lb

## 2019-08-13 DIAGNOSIS — L680 Hirsutism: Secondary | ICD-10-CM

## 2019-08-13 DIAGNOSIS — I878 Other specified disorders of veins: Secondary | ICD-10-CM

## 2019-08-13 DIAGNOSIS — M170 Bilateral primary osteoarthritis of knee: Secondary | ICD-10-CM

## 2019-08-13 DIAGNOSIS — Z6841 Body Mass Index (BMI) 40.0 and over, adult: Secondary | ICD-10-CM

## 2019-08-13 DIAGNOSIS — E782 Mixed hyperlipidemia: Secondary | ICD-10-CM

## 2019-08-13 DIAGNOSIS — Z131 Encounter for screening for diabetes mellitus: Secondary | ICD-10-CM

## 2019-08-13 MED ORDER — TRAMADOL HCL 50 MG PO TABS: 50.0000 mg | ORAL_TABLET | Freq: Two times a day (BID) | ORAL | 0 refills | Status: DC | PRN

## 2019-08-13 MED ORDER — METHOCARBAMOL 500 MG PO TABS: 500.0000 mg | ORAL_TABLET | Freq: Three times a day (TID) | ORAL | 3 refills | Status: DC | PRN

## 2019-08-13 MED ORDER — SPIRONOLACTONE 50 MG PO TABS: 50.0000 mg | ORAL_TABLET | Freq: Two times a day (BID) | ORAL | 3 refills | Status: DC
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
## 2019-08-17 DIAGNOSIS — I878 Other specified disorders of veins: Secondary | ICD-10-CM | POA: Insufficient documentation

## 2019-08-30 ENCOUNTER — Encounter: Payer: Self-pay | Admitting: Orthopedic Surgery

## 2019-08-30 ENCOUNTER — Other Ambulatory Visit: Payer: Self-pay | Admitting: Surgical

## 2019-08-30 DIAGNOSIS — M170 Bilateral primary osteoarthritis of knee: Secondary | ICD-10-CM

## 2019-08-30 DIAGNOSIS — G5602 Carpal tunnel syndrome, left upper limb: Secondary | ICD-10-CM | POA: Diagnosis not present

## 2019-08-30 MED ORDER — METHOCARBAMOL 500 MG PO TABS: 500.0000 mg | ORAL_TABLET | Freq: Three times a day (TID) | ORAL | 3 refills | Status: DC | PRN

## 2019-08-30 MED ORDER — TRAMADOL HCL 50 MG PO TABS: 50.0000 mg | ORAL_TABLET | Freq: Four times a day (QID) | ORAL | 0 refills | Status: DC | PRN

## 2019-09-09 ENCOUNTER — Ambulatory Visit (INDEPENDENT_AMBULATORY_CARE_PROVIDER_SITE_OTHER): Payer: BC Managed Care – PPO | Admitting: Orthopedic Surgery

## 2019-09-09 DIAGNOSIS — G5603 Carpal tunnel syndrome, bilateral upper limbs: Secondary | ICD-10-CM

## 2019-09-11 ENCOUNTER — Encounter: Payer: Self-pay | Admitting: Orthopedic Surgery
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
## 2019-10-01 ENCOUNTER — Ambulatory Visit (INDEPENDENT_AMBULATORY_CARE_PROVIDER_SITE_OTHER): Payer: BC Managed Care – PPO | Admitting: Orthopedic Surgery

## 2019-10-01 DIAGNOSIS — G5603 Carpal tunnel syndrome, bilateral upper limbs: Secondary | ICD-10-CM

## 2019-10-03 ENCOUNTER — Encounter: Payer: Self-pay | Admitting: Orthopedic Surgery
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
## 2019-10-11 ENCOUNTER — Encounter: Payer: Self-pay | Admitting: Physician Assistant

## 2019-10-14 ENCOUNTER — Other Ambulatory Visit: Payer: Self-pay | Admitting: Physician Assistant

## 2019-10-20 ENCOUNTER — Encounter: Payer: Self-pay | Admitting: Orthopedic Surgery

## 2019-11-02 ENCOUNTER — Telehealth (INDEPENDENT_AMBULATORY_CARE_PROVIDER_SITE_OTHER): Payer: BC Managed Care – PPO | Admitting: Physician Assistant

## 2019-11-02 ENCOUNTER — Encounter: Payer: Self-pay | Admitting: Physician Assistant

## 2019-11-02 VITALS — Temp 97.7°F | Ht 64.0 in | Wt 291.0 lb

## 2019-11-02 DIAGNOSIS — J0140 Acute pansinusitis, unspecified: Secondary | ICD-10-CM | POA: Diagnosis not present

## 2019-11-02 DIAGNOSIS — R0602 Shortness of breath: Secondary | ICD-10-CM | POA: Diagnosis not present

## 2019-11-02 DIAGNOSIS — Z20822 Contact with and (suspected) exposure to covid-19: Secondary | ICD-10-CM | POA: Diagnosis not present

## 2019-11-02 DIAGNOSIS — J302 Other seasonal allergic rhinitis: Secondary | ICD-10-CM

## 2019-11-02 MED ORDER — AMOXICILLIN-POT CLAVULANATE 875-125 MG PO TABS: 1.0000 | ORAL_TABLET | Freq: Two times a day (BID) | ORAL | 0 refills | Status: DC

## 2019-11-02 MED ORDER — FLUTICASONE PROPIONATE 50 MCG/ACT NA SUSP: 2.0000 | Freq: Every day | NASAL | 1 refills | Status: DC

## 2019-11-02 NOTE — Progress Notes (Signed)
Symptoms started 1 week ago:  Sinus pressure Fever (102 highest) Fatigue Cough due to post nasal drip No appetite  No sore throat, no nausea, vomiting, diarrhea  Taking tylenol and ibuprofen, Zyrtec  Patient's phone battery is low with no charger, states if you lose connection she can be reached at [phone] room 425 (hotel).
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
## 2019-11-04 LAB — SARS-COV-2, NAA 2 DAY TAT

## 2019-11-04 LAB — NOVEL CORONAVIRUS, NAA: SARS-CoV-2, NAA: NOT DETECTED

## 2019-11-04 NOTE — Progress Notes (Signed)
Negative for covid

## 2019-11-15 ENCOUNTER — Ambulatory Visit (INDEPENDENT_AMBULATORY_CARE_PROVIDER_SITE_OTHER): Payer: BC Managed Care – PPO | Admitting: Physician Assistant

## 2019-11-15 DIAGNOSIS — Z5329 Procedure and treatment not carried out because of patient's decision for other reasons: Secondary | ICD-10-CM

## 2019-11-15 NOTE — Progress Notes (Signed)
No show

## 2019-11-19 ENCOUNTER — Other Ambulatory Visit: Payer: Self-pay | Admitting: Physician Assistant

## 2019-11-19 ENCOUNTER — Other Ambulatory Visit: Payer: Self-pay | Admitting: Surgical

## 2019-11-19 DIAGNOSIS — M170 Bilateral primary osteoarthritis of knee: Secondary | ICD-10-CM

## 2019-11-19 NOTE — Telephone Encounter (Signed)
Last written 08/30/2019 #60 with no refills.  Last appt 11/02/2019 (sick visit, no showed visit on 11/15/2019) Pain contract UTD 08/13/2019

## 2019-11-19 NOTE — Telephone Encounter (Signed)
PT scheduled on 12/10/19. Asked for a refill on her Tramadol. Please advice.

## 2019-11-19 NOTE — Telephone Encounter (Signed)
Pls advise.  

## 2019-11-22 MED ORDER — TRAMADOL HCL 50 MG PO TABS: 50.0000 mg | ORAL_TABLET | Freq: Four times a day (QID) | ORAL | 0 refills | Status: DC | PRN

## 2019-11-23 LAB — COMPLETE METABOLIC PANEL WITH GFR
AG Ratio: 1.1 (calc) (ref 1.0–2.5)
ALT: 42 U/L — ABNORMAL HIGH (ref 6–29)
AST: 25 U/L (ref 10–35)
Albumin: 3.8 g/dL (ref 3.6–5.1)
Alkaline phosphatase (APISO): 68 U/L (ref 37–153)
BUN: 13 mg/dL (ref 7–25)
CO2: 26 mmol/L (ref 20–32)
Calcium: 9.2 mg/dL (ref 8.6–10.4)
Chloride: 105 mmol/L (ref 98–110)
Creat: 0.67 mg/dL (ref 0.50–1.05)
GFR, Est African American: 114 mL/min/{1.73_m2} (ref >=60)
GFR, Est Non African American: 98 mL/min/{1.73_m2} (ref >=60)
Globulin: 3.4 g/dL (calc) (ref 1.9–3.7)
Glucose, Bld: 89 mg/dL (ref 65–99)
Potassium: 4.5 mmol/L (ref 3.5–5.3)
Sodium: 139 mmol/L (ref 135–146)
Total Bilirubin: 0.5 mg/dL (ref 0.2–1.2)
Total Protein: 7.2 g/dL (ref 6.1–8.1)

## 2019-11-23 LAB — LIPID PANEL W/REFLEX DIRECT LDL
Cholesterol: 183 mg/dL (ref <200)
HDL: 47 mg/dL — ABNORMAL LOW (ref >=50)
LDL Cholesterol (Calc): 117 mg/dL (calc) — ABNORMAL HIGH
Non-HDL Cholesterol (Calc): 136 mg/dL (calc) — ABNORMAL HIGH (ref <130)
Total CHOL/HDL Ratio: 3.9 (calc) (ref <5.0)
Triglycerides: 88 mg/dL (ref <150)

## 2019-11-23 NOTE — Progress Notes (Signed)
Alicia White,   Liver enzyme a little up. Sugar looks great. Your cholesterol is much better!

## 2019-11-29 ENCOUNTER — Other Ambulatory Visit: Payer: Self-pay | Admitting: Physician Assistant

## 2019-12-10 ENCOUNTER — Ambulatory Visit: Payer: BC Managed Care – PPO | Admitting: Physician Assistant

## 2019-12-10 ENCOUNTER — Telehealth: Payer: Self-pay | Admitting: Physician Assistant

## 2019-12-10 MED ORDER — MELOXICAM 15 MG PO TABS: ORAL_TABLET | ORAL | 0 refills | Status: DC

## 2019-12-10 NOTE — Telephone Encounter (Signed)
Sent mobic for refills.

## 2019-12-21 ENCOUNTER — Ambulatory Visit (INDEPENDENT_AMBULATORY_CARE_PROVIDER_SITE_OTHER): Payer: BC Managed Care – PPO | Admitting: Physician Assistant

## 2019-12-21 ENCOUNTER — Encounter: Payer: Self-pay | Admitting: Physician Assistant

## 2019-12-21 VITALS — BP 121/59 | HR 90 | Ht 64.0 in | Wt 308.0 lb

## 2019-12-21 DIAGNOSIS — B839 Helminthiasis, unspecified: Secondary | ICD-10-CM

## 2019-12-21 DIAGNOSIS — L03011 Cellulitis of right finger: Secondary | ICD-10-CM | POA: Diagnosis not present

## 2019-12-21 DIAGNOSIS — B351 Tinea unguium: Secondary | ICD-10-CM | POA: Diagnosis not present

## 2019-12-21 MED ORDER — CICLOPIROX 8 % EX SOLN: Freq: Every day | CUTANEOUS | 0 refills | Status: DC

## 2019-12-21 MED ORDER — ALBENDAZOLE 200 MG PO TABS: 400.0000 mg | ORAL_TABLET | Freq: Once | ORAL | 0 refills | Status: DC

## 2019-12-21 MED ORDER — DOXYCYCLINE HYCLATE 100 MG PO TABS: 100.0000 mg | ORAL_TABLET | Freq: Two times a day (BID) | ORAL | 0 refills | Status: DC

## 2019-12-21 MED ORDER — TRIAMCINOLONE ACETONIDE 0.1 % EX CREA: 1.0000 "application " | TOPICAL_CREAM | Freq: Two times a day (BID) | CUTANEOUS | 0 refills | Status: DC
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
## 2019-12-22 ENCOUNTER — Encounter: Payer: Self-pay | Admitting: Physician Assistant

## 2019-12-22 ENCOUNTER — Encounter (INDEPENDENT_AMBULATORY_CARE_PROVIDER_SITE_OTHER): Payer: Self-pay

## 2019-12-24 ENCOUNTER — Encounter: Payer: Self-pay | Admitting: Physician Assistant

## 2019-12-24 DIAGNOSIS — B839 Helminthiasis, unspecified: Secondary | ICD-10-CM

## 2019-12-24 LAB — OVA AND PARASITE EXAMINATION

## 2019-12-24 NOTE — Telephone Encounter (Signed)
Can we find out why ova and parasite was not ran.

## 2019-12-24 NOTE — Progress Notes (Signed)
Can we find out why canceled?

## 2019-12-27 ENCOUNTER — Other Ambulatory Visit: Payer: Self-pay | Admitting: Physician Assistant

## 2019-12-27 DIAGNOSIS — B351 Tinea unguium: Secondary | ICD-10-CM | POA: Insufficient documentation

## 2019-12-27 DIAGNOSIS — B839 Helminthiasis, unspecified: Secondary | ICD-10-CM

## 2019-12-27 MED ORDER — MUPIROCIN 2 % EX OINT: TOPICAL_OINTMENT | CUTANEOUS | 1 refills | Status: DC

## 2019-12-27 MED ORDER — PERMETHRIN 5 % EX CREA: 1.0000 "application " | TOPICAL_CREAM | Freq: Once | CUTANEOUS | 1 refills | Status: DC

## 2019-12-27 MED ORDER — TERBINAFINE HCL 250 MG PO TABS: 250.0000 mg | ORAL_TABLET | Freq: Every day | ORAL | 1 refills | Status: AC

## 2019-12-29 MED ORDER — ALBENDAZOLE 200 MG PO TABS: 400.0000 mg | ORAL_TABLET | Freq: Once | ORAL | 0 refills | Status: AC

## 2019-12-30 ENCOUNTER — Other Ambulatory Visit: Payer: Self-pay | Admitting: Physician Assistant

## 2019-12-30 DIAGNOSIS — M170 Bilateral primary osteoarthritis of knee: Secondary | ICD-10-CM

## 2019-12-30 NOTE — Telephone Encounter (Signed)
Alicia White we can order ova and parasite for him to take to the lab. Alicia [place].

## 2020-01-04 LAB — OVA AND PARASITE EXAMINATION
CONCENTRATE RESULT:: NONE SEEN
MICRO NUMBER:: 11180330
SPECIMEN QUALITY:: ADEQUATE
TRICHROME RESULT:: NONE SEEN

## 2020-01-04 NOTE — Progress Notes (Signed)
No parasites detected.

## 2020-01-18 ENCOUNTER — Encounter: Payer: Self-pay | Admitting: Physician Assistant

## 2020-01-18 DIAGNOSIS — W57XXXD Bitten or stung by nonvenomous insect and other nonvenomous arthropods, subsequent encounter: Secondary | ICD-10-CM

## 2020-01-18 DIAGNOSIS — T148XXA Other injury of unspecified body region, initial encounter: Secondary | ICD-10-CM

## 2020-01-18 DIAGNOSIS — L299 Pruritus, unspecified: Secondary | ICD-10-CM

## 2020-01-21 ENCOUNTER — Encounter: Payer: Self-pay | Admitting: Physician Assistant

## 2020-02-01 NOTE — Telephone Encounter (Signed)
Patient is scheduled for 12/16 with infectious disease. - CF

## 2020-02-03 ENCOUNTER — Encounter: Payer: Self-pay | Admitting: Infectious Diseases

## 2020-02-03 ENCOUNTER — Ambulatory Visit (INDEPENDENT_AMBULATORY_CARE_PROVIDER_SITE_OTHER): Payer: BC Managed Care – PPO | Admitting: Infectious Diseases

## 2020-02-03 ENCOUNTER — Other Ambulatory Visit: Payer: Self-pay

## 2020-02-03 ENCOUNTER — Other Ambulatory Visit (HOSPITAL_COMMUNITY)
Admission: RE | Admit: 2020-02-03 | Discharge: 2020-02-03 | Disposition: A | Discharge status: Home / Self Care | Payer: BC Managed Care – PPO | Source: Ambulatory Visit | Attending: Infectious Diseases | Admitting: Infectious Diseases

## 2020-02-03 VITALS — BP 140/89 | HR 94 | Temp 97.9°F | Wt 313.8 lb

## 2020-02-03 DIAGNOSIS — Z113 Encounter for screening for infections with a predominantly sexual mode of transmission: Secondary | ICD-10-CM | POA: Diagnosis not present

## 2020-02-03 DIAGNOSIS — B839 Helminthiasis, unspecified: Secondary | ICD-10-CM

## 2020-02-03 DIAGNOSIS — B351 Tinea unguium: Secondary | ICD-10-CM | POA: Insufficient documentation

## 2020-02-03 NOTE — Assessment & Plan Note (Signed)
Urine GC HCV ab HIV test

## 2020-02-03 NOTE — Assessment & Plan Note (Signed)
Continue Terbinafine PO as prescribed by PCP

## 2020-02-03 NOTE — Assessment & Plan Note (Signed)
Stool ova and parasite examination*3 CBC with diff  Recently treated with albendazole 2 doses Fu in 2 months pending labs

## 2020-02-03 NOTE — Progress Notes (Addendum)
[facility]                                                             [place], [place], [place], [address]                                                                  Phn. [phone]; Fax: [phone]                                                                             Date: 02/03/2020   Reason for Referral: Worms in stool/nails and toes  Referring Provider: Donella Stade   Assessment 59 Year old Caucasian Female with a PMH of Asthma, Chronic Venous Stasis, HLD, OA, Endometrial carcinoma and Fibroids who is referred for evaluation of worms in the stool/finger nails and toe nails.   Work up so far has included CBC and CMP WNL except mild elevation in ALT at 42. No recent differential in CBC. Stool ova and parasite examination have been negative. She has been recently treated for possible parasitic infection with 2 doses of albendazole. She is also being treated with possible Onychomycosis by her PCP with Cicloprix and PO terbinafine. She has also been treated for possible scabies with Permethrin cream.   Plan Will get the following labs today  Orders Placed This Encounter  Procedures  . Ova and parasite examination  . Ova and parasite examination  . Ova and parasite examination  . HIV antibody (with reflex)  . Hepatitis C antibody  . Folate  . CBC w/Diff  . Ambulatory referral to Dermatology   Referral to Dermatology  Would be also beneficial to have a Psychiatry evaluation while evaluating for worm infestation  Fu pending labs/PRN  All questions and concerns were discussed and addressed. Patient verbalized understanding of the plan. ____________________________________________________________________________________________________________________  HPI: 59 Year old Caucasian Female with a PMH of Asthma, Chronic Venous Stasis, OA,  HLD, Endometrial carcinoma and  Fibroids who is referred for evaluation of worms in the stool/finger nails and toe nails. Patient is accompanied by a female who she says is her roommate. Both of them are providing history.   They say 2 years ago, they were cleaning one of their friend's basement membrane at which time her female roommate got stuck with something in his leg They looked at it but could not find anything. It looked like a volcano. He used antiseptics including bleach in that spot multiple times which finally help it resolve. However, this was followed by another similar spot  in his left arm 2 weeks later. He used permethrin cream in his body after that and has used it multiple times without any significant events.   4-5 months ago, patient started having similar  spots/weiurd appearance in her finger nails. She uses a microscope to see  Under her finger nail and has seem few thread like stuff which she thinks it might be worms. She and her roommate showed me multiple pictures in their phone taken with the help of microscope. The thread like material did not appear to be a worm to me. They say they have gone to multiple doctors, 6 doctors so far and all of them ,think they would discard their concerns and would not come close to them. She says she has lost almost all her nails with these worms. She also says she passed worms in her stool as well. She was recently treated with albendazole 2 doses by her PCP. She says it helped her somewhat and thinks she needs to continue taking it daily.  She says she used to have low grade fevers on and off but not currently, denies chills and sweats. Has nausea occasionally, denies any vomiting. She continues to have gas but no abdominal pain and diarrhea. Appetite is good and has gained actually 40 lbs in the last 2 years. She has chronic increase  In frequency of urination and urgency. No other urinary complaints. She has been feeling fatigued lately. She says her bilateral ankles, bilateral  knees and all finger joints hurt. Her feel have swollen in the last 3-4 months to the extent that shoes don't fit her.  She was born in [place], has lived in [place] over 10 years.  She has worked in [employer] at  [place].  For several years now. No travel to outside country and other states except [place] and [place]. She previous;ly had 2 cats but no other pets at home. Denies exposure to farm animals. Denies any sexual activity in the last 2 years  Denies smoking alcohol occasioanlly smokes meth sometimes   She denies having any mental health disorders like Anxiety, Depression etc or taking any meds for mental health issues   ROS: 11 point ROS negative except as above   Past Medical History:  Diagnosis Date  . Asthma   . Endometrial carcinoma ([place])   . Fibroids   . Heart murmur   . Ovarian cyst   . Pneumonia    Past Surgical History:  Procedure Laterality Date  . ABDOMINAL HYSTERECTOMY  07/01/12  . DENTAL SURGERY     Current Outpatient Medications on File Prior to Visit  Medication Sig Dispense Refill  . Cholecalciferol (VITAMIN D PO) Take 1,000 Units by mouth daily.    . clotrimazole (LOTRIMIN) 1 % cream Apply 1 application topically 2 (two) times daily. 80 g 1  . Cyanocobalamin (RA VITAMIN B-12 TR) 1000 MCG TBCR Take 1 tablet by mouth daily.    . cyclobenzaprine (FLEXERIL) 5 MG tablet Take 1 tablet (5 mg total) by mouth 3 (three) times daily as needed for muscle spasms. 90 tablet 3  . fluticasone (FLONASE) 50 MCG/ACT nasal spray Place 2 sprays into both nostrils daily. 16 g 1  . gabapentin (NEURONTIN) 100 MG capsule TAKE ONE CAPSULE BY MOUTH 2 TIMES A DAY 60 capsule 11  . meloxicam (MOBIC) 15 MG tablet Take 1 tablet (15 mg total) by mouth daily. *replaces celebrex* 90 tablet 0  . methocarbamol (ROBAXIN) 500 MG tablet Take 1 tablet (500 mg total) by mouth every 8 (eight) hours as needed. 90 tablet 3  . Multiple Vitamins-Minerals (CENTRUM SILVER ULTRA WOMENS PO) Take by  mouth daily.    [name] spironolactone (ALDACTONE)  50 MG tablet Take 1 tablet (50 mg total) by mouth 2 (two) times daily. 180 tablet 3  . terbinafine (LAMISIL) 250 MG tablet Take 1 tablet (250 mg total) by mouth daily. 90 tablet 1  . traMADol (ULTRAM) 50 MG tablet Take 1 tablet (50 mg total) by mouth every 6 (six) hours as needed. 60 tablet 0  . ciclopirox (PENLAC) 8 % solution Apply topically at bedtime. Apply over nail and surrounding skin. Apply daily over previous coat. After seven (7) days, may remove with alcohol and continue cycle. (Patient not taking: Reported on 02/03/2020) 6.6 mL 0   No current facility-administered medications on file prior to visit.   Allergies  Allergen Reactions  . Celebrex [Celecoxib] Other (See Comments)    Acid reflux  . Saxenda [Liraglutide -Weight Management]     More hungry.   Social History   Socioeconomic History  . Marital status: Single    Spouse name: Not on file  . Number of children: Not on file  . Years of education: Not on file  . Highest education level: Not on file  Occupational History  . Not on file  Tobacco Use  . Smoking status: Never Smoker  . Smokeless tobacco: Never Used  Substance and Sexual Activity  . Alcohol use: Yes    Comment: occassional  . Drug use: No  . Sexual activity: Yes  Other Topics Concern  . Not on file  Social History Narrative  . Not on file   Social Determinants of Health   Financial Resource Strain: Not on file  Food Insecurity: Not on file  Transportation Needs: Not on file  Physical Activity: Not on file  Stress: Not on file  Social Connections: Not on file  Intimate Partner Violence: Not on file     Vitals BP 140/89   Pulse 94   Temp 97.9 F (36.6 C) (Oral)   Wt (!) 313 lb 12.8 oz (142.3 kg)   LMP 06/14/2012   BMI 53.86 kg/m    Examination  General - not in acute distress, comfortably sitting in chair, ANXIOUS AND FRUSTRATED, MORBIDLY OBESE  HEENT - PEERLA, no pallor and no  icterus Chest - b/l clear air entry, no additional sounds CVS- Normal s1s2, RRR Abdomen - Soft, Non tender , non distended, OBESE ABDOMEN Ext- MINIMAL PEDAL EDEMA ? onynchomycosis in her toe nails  Neuro: grossly normal Back - WNL Psych : calm and cooperative Skin - no obvious lesions/rashes   Recent labs CBC Latest Ref Rng & Units 03/26/2017 04/14/2013 03/24/2013  WBC 3.8 - 10.8 Thousand/uL 8.0 16.9(H) 9.5  Hemoglobin 11.7 - 15.5 g/dL 13.8 13.6 12.4  Hematocrit 35.0 - 45.0 % 40.4 40.3 37.0  Platelets 140 - 400 Thousand/uL 238 260 243   CMP Latest Ref Rng & Units 11/22/2019 03/26/2017 05/30/2015  Glucose 65 - 99 mg/dL 89 81 94  BUN 7 - 25 mg/dL 13 10 10   Creatinine 0.50 - 1.05 mg/dL 0.67 0.68 0.57  Sodium 135 - 146 mmol/L 139 141 140  Potassium 3.5 - 5.3 mmol/L 4.5 4.2 4.2  Chloride 98 - 110 mmol/L 105 105 105  CO2 20 - 32 mmol/L 26 27 23   Calcium 8.6 - 10.4 mg/dL 9.2 9.5 9.1  Total Protein 6.1 - 8.1 g/dL 7.2 7.6 7.2  Total Bilirubin 0.2 - 1.2 mg/dL 0.5 0.6 0.6  Alkaline Phos 33 - 130 U/L - - 49  AST 10 - 35 U/L 25 18 22   ALT 6 - 29 U/L  42(H) 28 39(H)     Pertinent Microbiology Results for orders placed or performed in visit on 12/27/19  Ova and parasite examination     Status: None   Collection Time: 12/28/19 10:42 AM   Specimen: Per Rectum; Stool  Result Value Ref Range Status   MICRO NUMBER: [id number]  Final   SPECIMEN QUALITY: Adequate  Final   Source STOOL  Final   STATUS: FINAL  Final   CONCENTRATE RESULT: No ova or parasites seen  Final   TRICHROME RESULT: No ova or parasites seen  Final   COMMENT:   Final    Routine Ova and Parasite exam may not detect some parasites that occasionally cause diarrheal illness. Cryptosporidium Antigen and/or Cyclospora and Isospora Exam may be ordered to detect these parasites. One negative sample does not necessarily rule out  the presence of a parasitic infection.  For additional information, please refer to  https://education.questdiagnostics.com/faq/FAQ203 (This link is being provided for informational/ educational purposes only.)     All pertinent labs/Imagings/notes reviewed. All pertinent plain films and CT images have been personally visualized and interpreted; radiology reports have been reviewed. Decision making incorporated into the Impression / Recommendations.  I spent greater than 25 minutes with the patient including  review of prior medical records with greater than 50% of time in face to face counsel of the patient.    Electronically signed by:  Rosiland Oz, MD Infectious Disease Physician [facility] [address] [place], [place] [address] Phone: [phone]  Fax: [phone]

## 2020-02-04 LAB — CBC WITH DIFFERENTIAL/PLATELET
Absolute Monocytes: 787 cells/uL (ref 200–950)
Basophils Absolute: 29 cells/uL (ref 0–200)
Basophils Relative: 0.3 %
Eosinophils Absolute: 230 cells/uL (ref 15–500)
Eosinophils Relative: 2.4 %
HCT: 40.1 % (ref 35.0–45.0)
Hemoglobin: 13.3 g/dL (ref 11.7–15.5)
Lymphs Abs: 1891 cells/uL (ref 850–3900)
MCH: 29.7 pg (ref 27.0–33.0)
MCHC: 33.2 g/dL (ref 32.0–36.0)
MCV: 89.5 fL (ref 80.0–100.0)
MPV: 10.6 fL (ref 7.5–12.5)
Monocytes Relative: 8.2 %
Neutro Abs: 6662 cells/uL (ref 1500–7800)
Neutrophils Relative %: 69.4 %
Platelets: 295 10*3/uL (ref 140–400)
RBC: 4.48 10*6/uL (ref 3.80–5.10)
RDW: 12.9 % (ref 11.0–15.0)
Total Lymphocyte: 19.7 %
WBC: 9.6 10*3/uL (ref 3.8–10.8)

## 2020-02-04 LAB — HEPATITIS C ANTIBODY
Hepatitis C Ab: NONREACTIVE
SIGNAL TO CUT-OFF: 0.01 (ref <1.00)

## 2020-02-04 LAB — HIV ANTIBODY (ROUTINE TESTING W REFLEX): HIV 1&2 Ab, 4th Generation: NONREACTIVE

## 2020-02-04 LAB — FOLATE: Folate: 18.9 ng/mL

## 2020-02-07 LAB — URINE CYTOLOGY ANCILLARY ONLY
Bacterial Vaginitis-Urine: NEGATIVE
Candida Urine: NEGATIVE
Chlamydia: NEGATIVE
Comment: NEGATIVE
Comment: NEGATIVE
Comment: NORMAL
Neisseria Gonorrhea: NEGATIVE
Trichomonas: NEGATIVE

## 2020-02-21 ENCOUNTER — Other Ambulatory Visit: Payer: Self-pay | Admitting: Physician Assistant

## 2020-02-21 DIAGNOSIS — M170 Bilateral primary osteoarthritis of knee: Secondary | ICD-10-CM

## 2020-02-23 NOTE — Telephone Encounter (Signed)
Last written 12/30/2019 #60 no refills Last appt 12/21/2019

## 2020-03-28 ENCOUNTER — Other Ambulatory Visit: Payer: Self-pay | Admitting: Physician Assistant

## 2020-03-30 ENCOUNTER — Telehealth: Payer: Self-pay

## 2020-03-30 ENCOUNTER — Ambulatory Visit: Payer: BC Managed Care – PPO | Admitting: Infectious Diseases

## 2020-03-30 NOTE — Telephone Encounter (Signed)
-----   Message from Rosiland Oz, MD sent at 03/30/2020  7:34 AM EST ----- Regarding: Appointment today I reviewed her labs from last visit and nothing is significant. She has also been seen by dermatologist and can continue to follow up for the skin lesions. I don't think so she needs to follow up with me today unless she has new concerns.

## 2020-03-30 NOTE — Telephone Encounter (Signed)
Attempted to call patient with provider's message and to cancel appointment. Not able to reach patient at this time. Will send mychart message. [place]

## 2020-04-26 ENCOUNTER — Other Ambulatory Visit: Payer: Self-pay | Admitting: Physician Assistant

## 2020-04-26 DIAGNOSIS — G8929 Other chronic pain: Secondary | ICD-10-CM

## 2020-04-26 DIAGNOSIS — M545 Low back pain, unspecified: Secondary | ICD-10-CM

## 2020-04-26 DIAGNOSIS — M170 Bilateral primary osteoarthritis of knee: Secondary | ICD-10-CM

## 2020-05-16 IMAGING — MG DIGITAL SCREENING BILAT W/ TOMO W/ CAD
6 of 10 series · 6 of 30 positions shown · non-contrast
Comparison: Previous exam(s).

ACR Breast Density Category a: The breast tissue is almost entirely
fatty.

CLINICAL DATA: Screening.

EXAM:
DIGITAL SCREENING BILATERAL MAMMOGRAM WITH TOMO AND CAD

[L CC synth-2D]
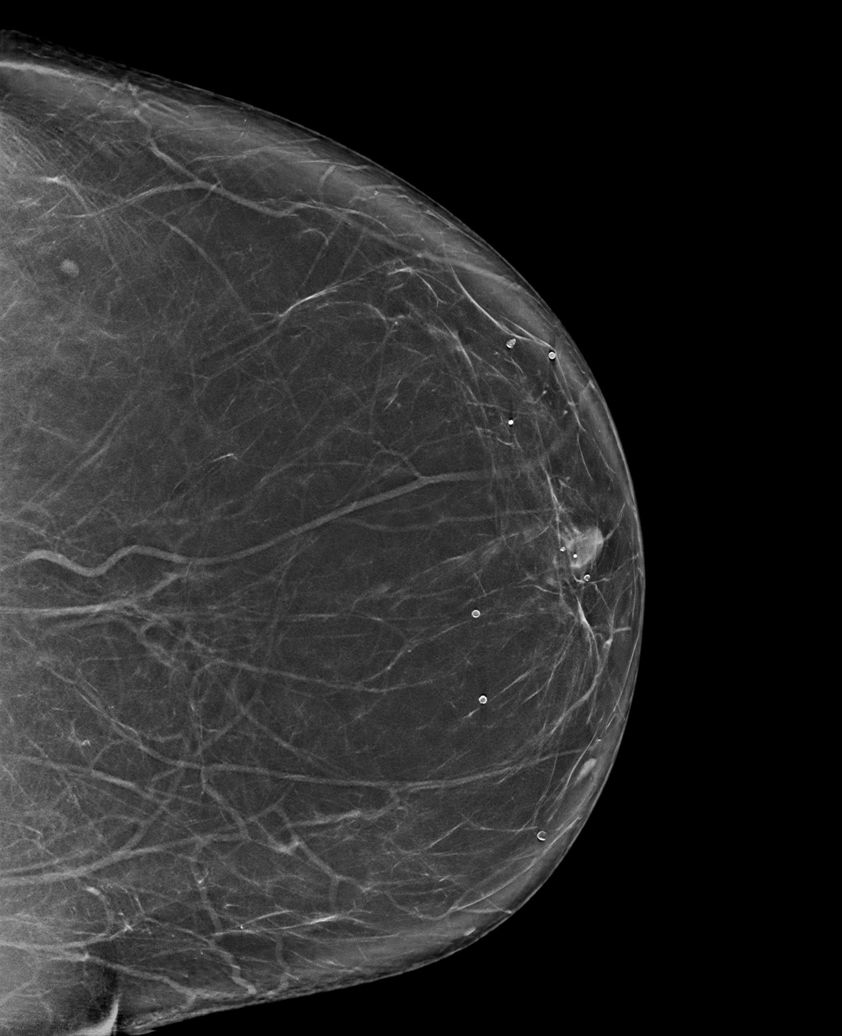

[R CC synth-2D]
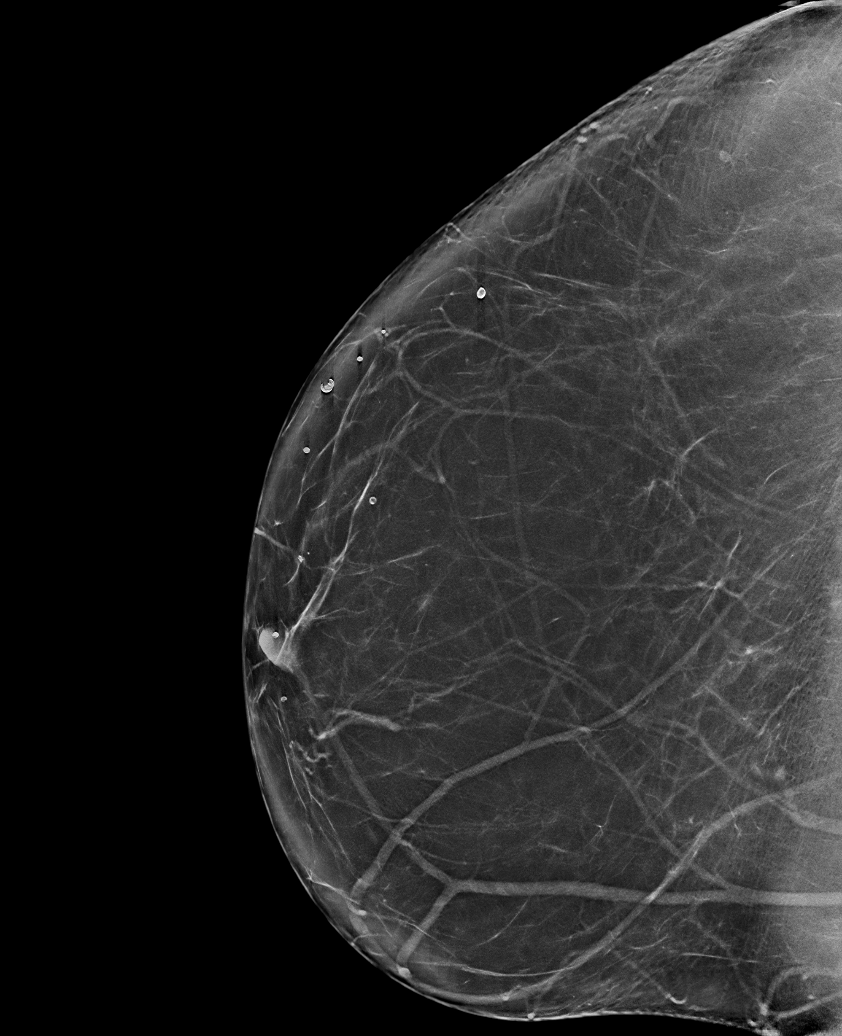

[R CV synth-2D (1 of 2)]
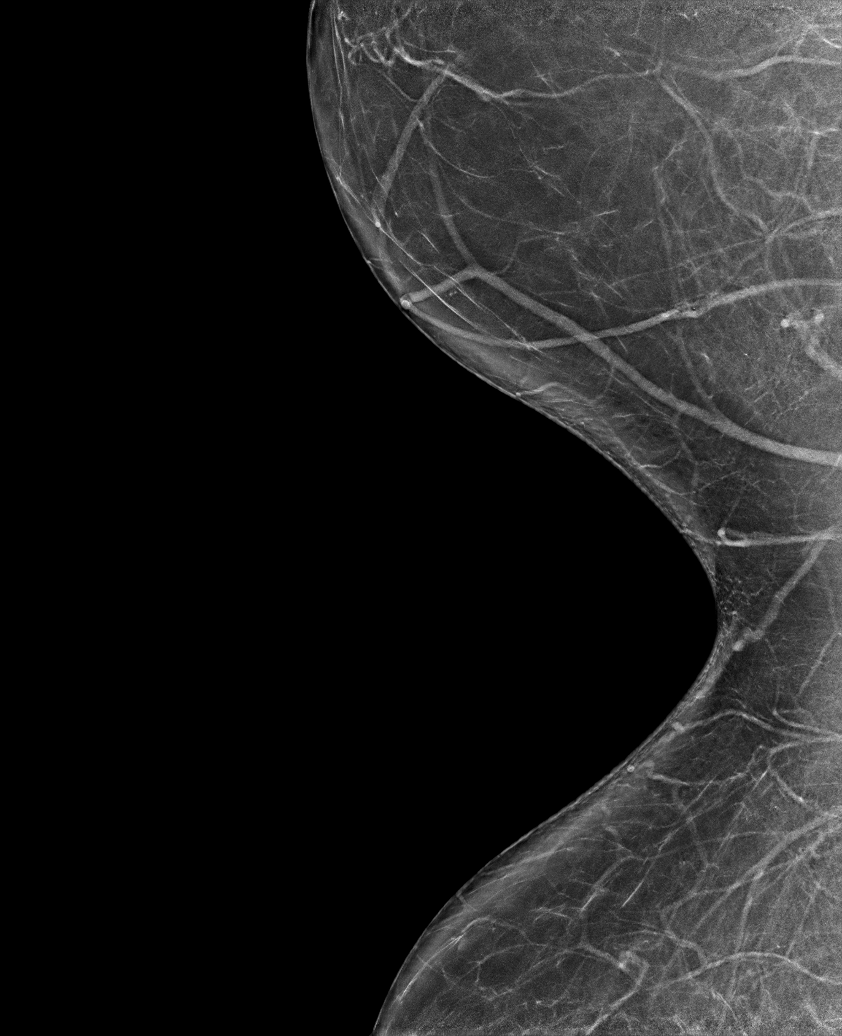

[R CV synth-2D (2 of 2)]
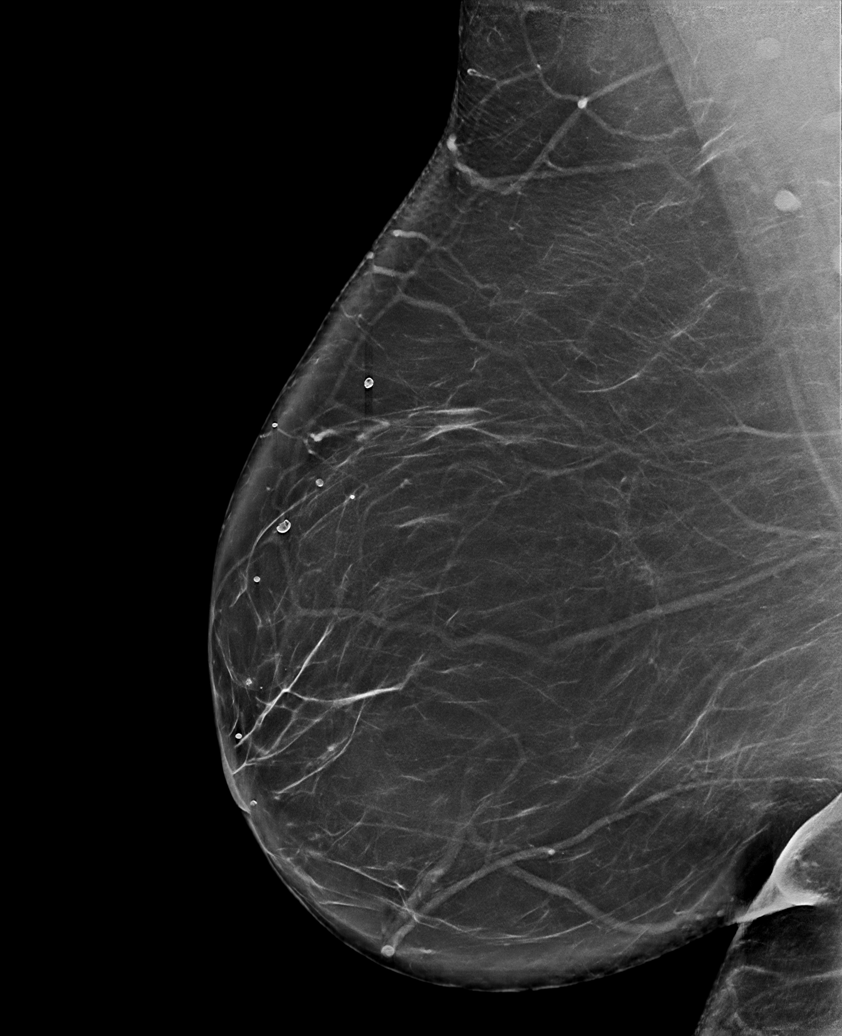

[L MLO synth-2D]
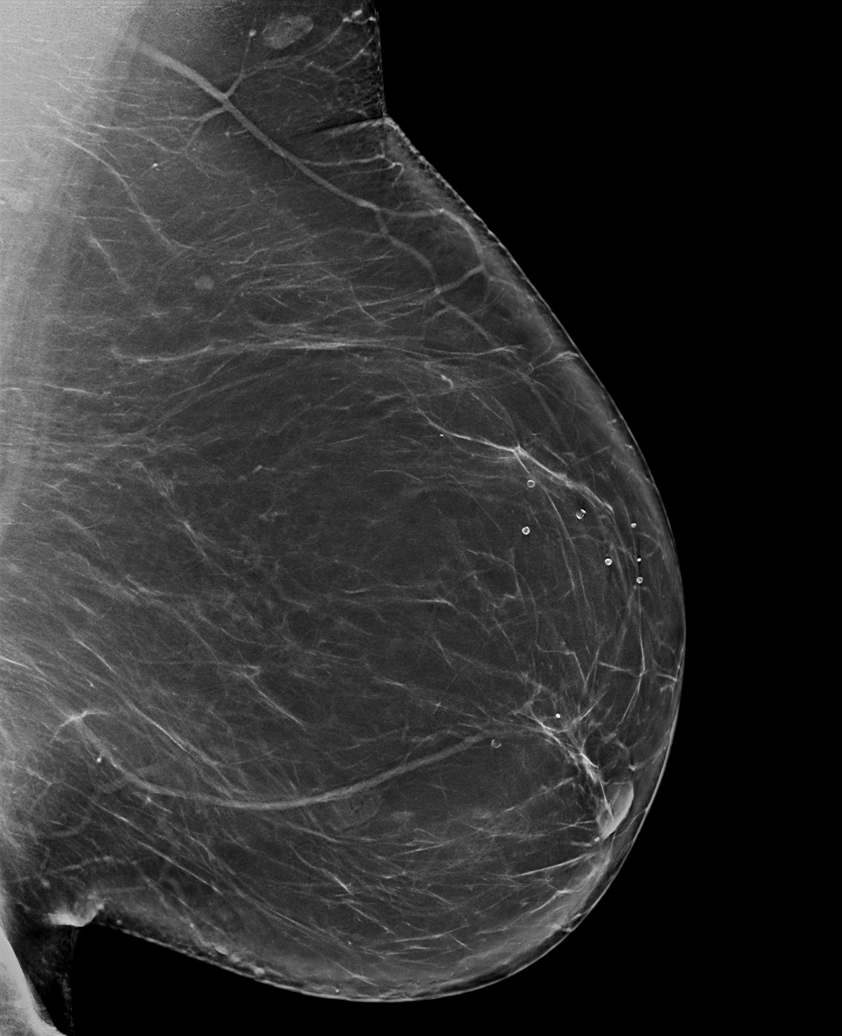

[L MLO tomo · tomo slice 50/99.0]
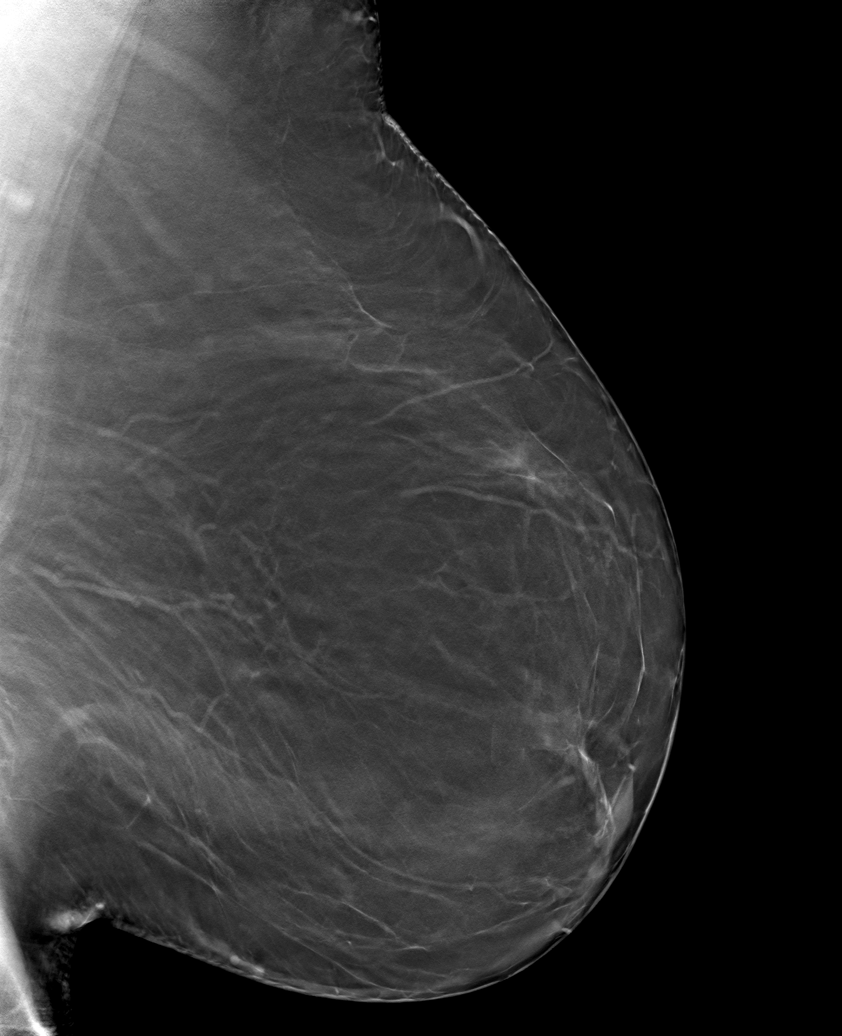

[6 of 30 positions shown; findings below may reference images not displayed]

FINDINGS: There are no findings suspicious for malignancy. Images were
processed with CAD.
IMPRESSION: No mammographic evidence of malignancy. A result letter of this
screening mammogram will be mailed directly to the patient.

RECOMMENDATION:
Screening mammogram in one year. (Code:[id number])

BI-RADS CATEGORY  1: Negative.

## 2020-05-27 ENCOUNTER — Other Ambulatory Visit: Payer: Self-pay | Admitting: Physician Assistant

## 2020-06-16 ENCOUNTER — Other Ambulatory Visit: Payer: Self-pay | Admitting: Surgical

## 2020-06-16 DIAGNOSIS — M170 Bilateral primary osteoarthritis of knee: Secondary | ICD-10-CM

## 2020-07-07 ENCOUNTER — Ambulatory Visit (INDEPENDENT_AMBULATORY_CARE_PROVIDER_SITE_OTHER): Payer: Self-pay | Admitting: Physician Assistant

## 2020-07-07 DIAGNOSIS — Z5329 Procedure and treatment not carried out because of patient's decision for other reasons: Secondary | ICD-10-CM

## 2020-07-07 NOTE — Progress Notes (Signed)
No show

## 2020-08-23 ENCOUNTER — Encounter: Payer: Self-pay | Admitting: Physician Assistant

## 2020-10-19 ENCOUNTER — Other Ambulatory Visit: Payer: Self-pay | Admitting: Physician Assistant

## 2020-10-19 DIAGNOSIS — Z1231 Encounter for screening mammogram for malignant neoplasm of breast: Secondary | ICD-10-CM

## 2020-11-08 ENCOUNTER — Ambulatory Visit (INDEPENDENT_AMBULATORY_CARE_PROVIDER_SITE_OTHER): Payer: BC Managed Care – PPO | Admitting: Physician Assistant

## 2020-11-08 ENCOUNTER — Encounter: Payer: Self-pay | Admitting: Physician Assistant

## 2020-11-08 ENCOUNTER — Other Ambulatory Visit: Payer: Self-pay

## 2020-11-08 ENCOUNTER — Ambulatory Visit (INDEPENDENT_AMBULATORY_CARE_PROVIDER_SITE_OTHER): Payer: BC Managed Care – PPO

## 2020-11-08 VITALS — BP 151/76 | HR 72 | Ht 64.0 in | Wt 312.0 lb

## 2020-11-08 DIAGNOSIS — Z0000 Encounter for general adult medical examination without abnormal findings: Secondary | ICD-10-CM | POA: Diagnosis not present

## 2020-11-08 DIAGNOSIS — E782 Mixed hyperlipidemia: Secondary | ICD-10-CM

## 2020-11-08 DIAGNOSIS — G8929 Other chronic pain: Secondary | ICD-10-CM

## 2020-11-08 DIAGNOSIS — Z6841 Body Mass Index (BMI) 40.0 and over, adult: Secondary | ICD-10-CM

## 2020-11-08 DIAGNOSIS — G478 Other sleep disorders: Secondary | ICD-10-CM

## 2020-11-08 DIAGNOSIS — M170 Bilateral primary osteoarthritis of knee: Secondary | ICD-10-CM

## 2020-11-08 DIAGNOSIS — Z131 Encounter for screening for diabetes mellitus: Secondary | ICD-10-CM

## 2020-11-08 DIAGNOSIS — Z1329 Encounter for screening for other suspected endocrine disorder: Secondary | ICD-10-CM

## 2020-11-08 DIAGNOSIS — L680 Hirsutism: Secondary | ICD-10-CM

## 2020-11-08 DIAGNOSIS — R0683 Snoring: Secondary | ICD-10-CM

## 2020-11-08 DIAGNOSIS — I878 Other specified disorders of veins: Secondary | ICD-10-CM

## 2020-11-08 DIAGNOSIS — M545 Low back pain, unspecified: Secondary | ICD-10-CM

## 2020-11-08 DIAGNOSIS — Z1231 Encounter for screening mammogram for malignant neoplasm of breast: Secondary | ICD-10-CM | POA: Diagnosis not present

## 2020-11-08 DIAGNOSIS — M25561 Pain in right knee: Secondary | ICD-10-CM

## 2020-11-08 DIAGNOSIS — M25562 Pain in left knee: Secondary | ICD-10-CM

## 2020-11-08 MED ORDER — GABAPENTIN 100 MG PO CAPS: ORAL_CAPSULE | ORAL | 1 refills | Status: DC

## 2020-11-08 MED ORDER — CYCLOBENZAPRINE HCL 10 MG PO TABS: 10.0000 mg | ORAL_TABLET | Freq: Three times a day (TID) | ORAL | 1 refills | Status: DC | PRN

## 2020-11-08 MED ORDER — TRAMADOL HCL 50 MG PO TABS
100.0000 mg | ORAL_TABLET | Freq: Four times a day (QID) | ORAL | 0 refills | Status: DC | PRN
Start: 2020-11-08 — End: 2021-06-11

## 2020-11-08 MED ORDER — DULOXETINE HCL 30 MG PO CPEP: 30.0000 mg | ORAL_CAPSULE | Freq: Every day | ORAL | 0 refills | Status: DC

## 2020-11-08 MED ORDER — MELOXICAM 15 MG PO TABS: ORAL_TABLET | ORAL | 3 refills | Status: DC

## 2020-11-08 MED ORDER — SPIRONOLACTONE 50 MG PO TABS: 50.0000 mg | ORAL_TABLET | Freq: Two times a day (BID) | ORAL | 3 refills | Status: DC

## 2020-11-08 MED ORDER — SAXENDA 18 MG/3ML ~~LOC~~ SOPN: 3.0000 mg | PEN_INJECTOR | Freq: Every day | SUBCUTANEOUS | 0 refills | Status: DC

## 2020-11-08 NOTE — Patient Instructions (Addendum)
Increased gabapentin Start cymbalta Schedule appt with Dr. Darene Lamer  Health Maintenance, Female Adopting a healthy lifestyle and getting preventive care are important in promoting health and wellness. Ask your health care provider about: The right schedule for you to have regular tests and exams. Things you can do on your own to prevent diseases and keep yourself healthy. What should I know about diet, weight, and exercise? Eat a healthy diet  Eat a diet that includes plenty of vegetables, fruits, low-fat dairy products, and lean protein. Do not eat a lot of foods that are high in solid fats, added sugars, or sodium. Maintain a healthy weight Body mass index (BMI) is used to identify weight problems. It estimates body fat based on height and weight. Your health care provider can help determine your BMI and help you achieve or maintain a healthy weight. Get regular exercise Get regular exercise. This is one of the most important things you can do for your health. Most adults should: Exercise for at least 150 minutes each week. The exercise should increase your heart rate and make you sweat (moderate-intensity exercise). Do strengthening exercises at least twice a week. This is in addition to the moderate-intensity exercise. Spend less time sitting. Even light physical activity can be beneficial. Watch cholesterol and blood lipids Have your blood tested for lipids and cholesterol at 58 years of age, then have this test every 5 years. Have your cholesterol levels checked more often if: Your lipid or cholesterol levels are high. You are older than 58 years of age. You are at high risk for heart disease. What should I know about cancer screening? Depending on your health history and family history, you may need to have cancer screening at various ages. This may include screening for: Breast cancer. Cervical cancer. Colorectal cancer. Skin cancer. Lung cancer. What should I know about heart  disease, diabetes, and high blood pressure? Blood pressure and heart disease High blood pressure causes heart disease and increases the risk of stroke. This is more likely to develop in people who have high blood pressure readings, are of African descent, or are overweight. Have your blood pressure checked: Every 3-5 years if you are 54-34 years of age. Every year if you are 26 years old or older. Diabetes Have regular diabetes screenings. This checks your fasting blood sugar level. Have the screening done: Once every three years after age 70 if you are at a normal weight and have a low risk for diabetes. More often and at a younger age if you are overweight or have a high risk for diabetes. What should I know about preventing infection? Hepatitis B If you have a higher risk for hepatitis B, you should be screened for this virus. Talk with your health care provider to find out if you are at risk for hepatitis B infection. Hepatitis C Testing is recommended for: Everyone born from [birth year] through 1963. Anyone with known risk factors for hepatitis C. Sexually transmitted infections (STIs) Get screened for STIs, including gonorrhea and chlamydia, if: You are sexually active and are younger than 58 years of age. You are older than 58 years of age and your health care provider tells you that you are at risk for this type of infection. Your sexual activity has changed since you were last screened, and you are at increased risk for chlamydia or gonorrhea. Ask your health care provider if you are at risk. Ask your health care provider about whether you are at high risk for  HIV. Your health care provider may recommend a prescription medicine to help prevent HIV infection. If you choose to take medicine to prevent HIV, you should first get tested for HIV. You should then be tested every 3 months for as long as you are taking the medicine. Pregnancy If you are about to stop having your period  (premenopausal) and you may become pregnant, seek counseling before you get pregnant. Take 400 to 800 micrograms (mcg) of folic acid every day if you become pregnant. Ask for birth control (contraception) if you want to prevent pregnancy. Osteoporosis and menopause Osteoporosis is a disease in which the bones lose minerals and strength with aging. This can result in bone fractures. If you are 70 years old or older, or if you are at risk for osteoporosis and fractures, ask your health care provider if you should: Be screened for bone loss. Take a calcium or vitamin D supplement to lower your risk of fractures. Be given hormone replacement therapy (HRT) to treat symptoms of menopause. Follow these instructions at home: Lifestyle Do not use any products that contain nicotine or tobacco, such as cigarettes, e-cigarettes, and chewing tobacco. If you need help quitting, ask your health care provider. Do not use street drugs. Do not share needles. Ask your health care provider for help if you need support or information about quitting drugs. Alcohol use Do not drink alcohol if: Your health care provider tells you not to drink. You are pregnant, may be pregnant, or are planning to become pregnant. If you drink alcohol: Limit how much you use to 0-1 drink a day. Limit intake if you are breastfeeding. Be aware of how much alcohol is in your drink. In the [place] one drink equals one 12 oz bottle of beer (355 mL), one 5 oz glass of wine (148 mL), or one 1 oz glass of hard liquor (44 mL). General instructions Schedule regular health, dental, and eye exams. Stay current with your vaccines. Tell your health care provider if: You often feel depressed. You have ever been abused or do not feel safe at home. Summary Adopting a healthy lifestyle and getting preventive care are important in promoting health and wellness. Follow your health care provider's instructions about healthy diet, exercising, and  getting tested or screened for diseases. Follow your health care provider's instructions on monitoring your cholesterol and blood pressure. This information is not intended to replace advice given to you by your health care provider. Make sure you discuss any questions you have with your health care provider. Document Revised: 04/14/2020 Document Reviewed: 01/28/2018 Elsevier Patient Education  2022 [employer].

## 2020-11-08 NOTE — Progress Notes (Signed)
Subjective:     See Alicia White is a 60 y.o. female and is here for a comprehensive physical exam. The patient reports problems - she is very frustrated by chronic knee/hip/back pain due to OA. She takes mobic daily. She finds herself not able to finish grocery shopping or walk her dog due to pain. She is also very frustrated at work and tired all the time. She admits to snoring and falling asleep at her desk.  .  Social History   Socioeconomic History   Marital status: Single    Spouse name: Not on file   Number of children: Not on file   Years of education: Not on file   Highest education level: Not on file  Occupational History   Not on file  Tobacco Use   Smoking status: Never   Smokeless tobacco: Never  Substance and Sexual Activity   Alcohol use: Yes    Comment: occassional   Drug use: No   Sexual activity: Yes  Other Topics Concern   Not on file  Social History Narrative   Not on file   Social Determinants of Health   Financial Resource Strain: Not on file  Food Insecurity: Not on file  Transportation Needs: Not on file  Physical Activity: Not on file  Stress: Not on file  Social Connections: Not on file  Intimate Partner Violence: Not on file   Health Maintenance  Topic Date Due   MAMMOGRAM  01/13/2020   COVID-19 Vaccine (1) 11/24/2020 (Originally 06/10/1961)   Zoster Vaccines- Shingrix (1 of 2) 02/07/2021 (Originally [birth year]/1981)   INFLUENZA VACCINE  05/18/2021 (Originally 09/18/2020)   TETANUS/TDAP  11/08/2021 (Originally 03/24/2019)   Fecal DNA (Cologuard)  04/23/2022   Hepatitis C Screening  Completed   HIV Screening  Completed   HPV VACCINES  Aged Out    The following portions of the patient's history were reviewed and updated as appropriate: allergies, current medications, past family history, past medical history, past social history, past surgical history, and problem list.  Review of Systems Constitutional: positive for fatigue Respiratory: positive for  dyspnea on exertion Musculoskeletal:positive for arthralgias, back pain, myalgias, stiff joints, and pain in hips and bilateral knees Behavioral/Psych: positive for anxiety, bad mood, depression, fatigue, and irritability   Objective:    BP (!) 151/76   Pulse 72   Ht 5\' 4"  (1.626 m)   Wt (!) 312 lb (141.5 kg)   LMP 06/14/2012   SpO2 100%   BMI 53.55 kg/m  General appearance: alert, cooperative, appears older than stated age, and morbidly obese Head: Normocephalic, without obvious abnormality, atraumatic Eyes: conjunctivae/corneas clear. PERRL, EOM's intact. Fundi benign. Ears: normal TM's and external ear canals both ears Nose: Nares normal. Septum midline. Mucosa normal. No drainage or sinus tenderness. Throat: lips, mucosa, and tongue normal; teeth and gums normal Neck: no adenopathy, no carotid bruit, no JVD, supple, symmetrical, trachea midline, and thyroid not enlarged, symmetric, no tenderness/mass/nodules Back: symmetric, no curvature. ROM normal. No CVA tenderness. Lungs: clear to auscultation bilaterally Heart: regular rate and rhythm, S1, S2 normal, no murmur, click, rub or gallop Abdomen: soft, non-tender; bowel sounds normal; no masses,  no organomegaly Extremities: venous stasis dermatitis noted Pulses: 2+ and symmetric Skin: Skin color, texture, turgor normal. No rashes or lesions or numerous moles Neurologic: Grossly normal  .. Depression screen [facility] 2/9 11/10/2020 03/26/2017  Decreased Interest 1 0  Down, Depressed, Hopeless 1 0  PHQ - 2 Score 2 0  Altered sleeping 1 -  Tired, decreased energy 1 -  Change in appetite 1 -  Feeling bad or failure about yourself  1 -  Trouble concentrating 1 -  Moving slowly or fidgety/restless 1 -  Suicidal thoughts 0 -  PHQ-9 Score 8 -        Assessment:    Healthy female exam.     Plan:    Marland KitchenMarland KitchenDinesha was seen today for annual exam.  Diagnoses and all orders for this visit:  Physical exam, routine -     TSH -      Lipid Panel w/reflex Direct LDL -     COMPLETE METABOLIC PANEL WITH GFR -     CBC with Differential/Platelet  Mixed hyperlipidemia -     Lipid Panel w/reflex Direct LDL  Screening for diabetes mellitus -     COMPLETE METABOLIC PANEL WITH GFR  Thyroid disorder screen -     TSH  Chronic left-sided low back pain without sciatica  Hirsutism -     spironolactone (ALDACTONE) 50 MG tablet; Take 1 tablet (50 mg total) by mouth 2 (two) times daily.  Chronic venous stasis -     spironolactone (ALDACTONE) 50 MG tablet; Take 1 tablet (50 mg total) by mouth 2 (two) times daily.  Primary osteoarthritis of both knees -     meloxicam (MOBIC) 15 MG tablet; Take 1 tablet (15 mg total) by mouth daily. -     gabapentin (NEURONTIN) 100 MG capsule; Take 1-2 capsules as needed up to three times a day. -     traMADol (ULTRAM) 50 MG tablet; Take 2 tablets (100 mg total) by mouth every 6 (six) hours as needed. -     DULoxetine (CYMBALTA) 30 MG capsule; Take 1 capsule (30 mg total) by mouth daily.  Snoring -     Home sleep test  Non-restorative sleep -     Home sleep test  Class 3 severe obesity due to excess calories with serious comorbidity and body mass index (BMI) of 50.0 to 59.9 in adult (HCC) -     Liraglutide -Weight Management (SAXENDA) 18 MG/3ML SOPN; Inject 3 mg into the skin daily. 0.6 mg inj subcut daily for 1 week, then incr by 0.6 mg weekly until reaching 3 mg injected subcut daily -     Home sleep test  Chronic pain of both knees -     meloxicam (MOBIC) 15 MG tablet; Take 1 tablet (15 mg total) by mouth daily. -     gabapentin (NEURONTIN) 100 MG capsule; Take 1-2 capsules as needed up to three times a day. -     traMADol (ULTRAM) 50 MG tablet; Take 2 tablets (100 mg total) by mouth every 6 (six) hours as needed. -     DULoxetine (CYMBALTA) 30 MG capsule; Take 1 capsule (30 mg total) by mouth daily.  Chronic bilateral low back pain without sciatica -     cyclobenzaprine (FLEXERIL) 10  MG tablet; Take 1 tablet (10 mg total) by mouth 3 (three) times daily as needed for muscle spasms. -     meloxicam (MOBIC) 15 MG tablet; Take 1 tablet (15 mg total) by mouth daily. -     gabapentin (NEURONTIN) 100 MG capsule; Take 1-2 capsules as needed up to three times a day. -     traMADol (ULTRAM) 50 MG tablet; Take 2 tablets (100 mg total) by mouth every 6 (six) hours as needed. -     DULoxetine (CYMBALTA) 30 MG capsule; Take 1 capsule (30  mg total) by mouth daily.   .. Discussed 150 minutes of exercise a week.  Encouraged vitamin D 1000 units and Calcium 1300mg  or 4 servings of dairy a day.  PHQ not to goal. Work is stressful.  Looks of risk factors for sleep apnea. Home sleep study ordered.  Marland KitchenMarland KitchenPDMP reviewed during this encounter. Sent break through tramadol.  Fasting labs ordered.  Mammogram UTd.  Declined pap.  Cologuard UTD.  Declined shingrix/flu/covid     Discussed for knee and hip pain to follow up with Dr. Darene Lamer, sports medicine he cand do some injections that could help.  Added cymbalta.  Increased gabapentin.  Added flexeril. Work on weight loss. Added saxenda. Discussed titration and side effects.  [name].Discussed low carb diet with 1500 calories and 80g of protein.  Exercising at least 150 minutes a week.  [employer] could be a Microbiologist.   See After Visit Summary for Counseling Recommendations

## 2020-11-14 NOTE — Addendum Note (Signed)
Addended byAnnamaria Helling on: 11/14/2020 10:16 AM   Modules accepted: Orders

## 2020-11-14 NOTE — Progress Notes (Signed)
Normal mammogram. Follow up in 1 year.

## 2020-11-28 ENCOUNTER — Ambulatory Visit: Payer: BC Managed Care – PPO | Admitting: Sports Medicine

## 2020-11-29 ENCOUNTER — Telehealth: Payer: Self-pay

## 2020-11-29 NOTE — Telephone Encounter (Signed)
Medication: Liraglutide -Weight Management ([place]) 18 MG/3ML SOPN Prior authorization submitted via CoverMyMeds on 11/28/2020 PA submission pending

## 2020-11-29 NOTE — Telephone Encounter (Signed)
Medication: Liraglutide -Weight Management ([place]) 18 MG/3ML SOPN Prior authorization determination received Medication has been approved Approval dates: 11/28/2020-03/31/2021  Patient aware via: [place] aware: Yes Provider aware via this encounter

## 2020-12-06 ENCOUNTER — Ambulatory Visit: Payer: Self-pay | Admitting: Physician Assistant

## 2021-04-09 ENCOUNTER — Telehealth: Payer: Self-pay

## 2021-04-09 NOTE — Telephone Encounter (Addendum)
Initiated Prior authorization KGO:[id number] 18MG /3ML pen-injectors Via: Covermymeds Case/Key:[id number] Status: approved  as of 04/09/21 Reason:this request is approved for the following time period: 04/09/2021 - 08/07/2021 Notified Pt via: Mychart

## 2021-06-11 ENCOUNTER — Encounter: Payer: Self-pay | Admitting: Physician Assistant

## 2021-06-11 ENCOUNTER — Ambulatory Visit (INDEPENDENT_AMBULATORY_CARE_PROVIDER_SITE_OTHER): Payer: BC Managed Care – PPO | Admitting: Physician Assistant

## 2021-06-11 VITALS — BP 138/75 | HR 103 | Temp 98.6°F | Ht 64.0 in | Wt 321.0 lb

## 2021-06-11 DIAGNOSIS — I809 Phlebitis and thrombophlebitis of unspecified site: Secondary | ICD-10-CM

## 2021-06-11 DIAGNOSIS — K1379 Other lesions of oral mucosa: Secondary | ICD-10-CM | POA: Diagnosis not present

## 2021-06-11 MED ORDER — PREDNISONE 50 MG PO TABS: ORAL_TABLET | ORAL | 0 refills | Status: DC

## 2021-06-11 MED ORDER — FUROSEMIDE 20 MG PO TABS: 20.0000 mg | ORAL_TABLET | Freq: Every day | ORAL | 0 refills | Status: DC

## 2021-06-11 MED ORDER — DOXYCYCLINE HYCLATE 100 MG PO TABS: 100.0000 mg | ORAL_TABLET | Freq: Two times a day (BID) | ORAL | 0 refills | Status: DC

## 2021-06-11 NOTE — Patient Instructions (Signed)
Thrombophlebitis ? ?Thrombophlebitis is a condition in which a blood clot and inflammation occur inside a vein. This can happen in the arms, legs, or torso. Thrombophlebitis may involve superficial veins, deep veins, or both. Superficial veins are close to the surface of the body and are part of the superficial venous system. Veins that are deeper inside the body are part of the deep venous system. ?When this condition happens in a superficial vein (superficial thrombophlebitis), it is usually not serious.However, when the condition happens in a vein that is part of the deep venous system (deep vein thrombosis, DVT), it can cause serious problems. ?What are the causes? ?This condition may be caused by: ?Infection, injury, or trauma to a vein. ?Inflammation of the veins. ?Medical conditions that can cause blood to clot more easily (hypercoagulable state). ?Backing up, or reflux, of blood flow through the veins (chronic venous insufficiency or venous stasis). ?What increases the risk? ?The following factors may make you more likely to develop this condition: ?Having a long, thin tube (catheter) put in a vein, such as a central line, port, or IV catheter. ?Getting certain medicines through a catheter that can irritate the vein. ?Pregnancy or having recently given birth. ?Cancer. ?Obesity. ?Taking oral contraceptive pills (OCPs) or hormone therapy (HT) medicines. ?Spasms of veins. ?Immobilization, or not moving the limbs for prolonged periods. ?What are the signs or symptoms? ?The main symptoms of this condition are: ?Swelling and pain in an arm or leg. If the affected vein is in the leg, you may feel pain while standing or walking. ?Warmth or redness in an arm or leg. ?Tenderness in the affected area when it is touched. ?Other symptoms include: ?Low-grade fever. ?Muscle aches. ?A bulging or hard vein (venous distension). ?In some cases, there are no symptoms. ?How is this diagnosed? ?This condition may be diagnosed  based on: ?Your symptoms and medical history. ?A physical exam. ?Tests, such as a test that uses sound waves to make images (duplex ultrasound). ?How is this treated? ?Treatment depends on how severe the condition is and which area of the body is affected. Treatment may include: ?Applying a warm compress or heating pad to affected areas. This may need to be repeated several times a day. ?Moving the affected limb. For example, you may need to start doing walking exercises right away. You will also be encouraged to continue your usual daily activities. ?Raising (elevating) the affected arm or leg above the level of your heart. ?Medicines, such as: ?Anti-inflammatory medicines, such as ibuprofen. ?Blood thinners (anticoagulants) or anti-platelet drugs such as aspirin. ?Removing an IV or central line that may be causing the problem. ?Wearing compression stockings to help prevent blood clots and reduce swelling in your legs. ?Follow these instructions at home: ?Medicines ?Take over-the-counter and prescription medicines only as told by your health care provider. ?If you are taking blood thinners: ?Talk with your health care provider before you take any medicines that contain aspirin or NSAIDs, such as ibuprofen. These medicines increase your risk for dangerous bleeding. ?Take your medicine exactly as told, at the same time every day. ?Avoid activities that could cause injury or bruising, and follow instructions about how to prevent falls. ?Wear a medical alert bracelet or carry a card that lists what medicines you take. ?Managing pain, stiffness, and swelling ? ?If directed, apply heat to the affected area as often as told by your health care provider. Use the heat source that your health care provider recommends, such as a moist heat pack  or a heating pad. ?Place a towel between your skin and the heat source. ?Leave the heat on for 20-30 minutes. ?Remove the heat if your skin turns bright red. This is especially  important if you are unable to feel pain, heat, or cold. You have a greater risk of getting burned. ?Elevate the affected area above the level of your heart while you are sitting or lying down. ?Activity ?Return to your normal activities as told by your health care provider. Ask your health care provider what activities are safe for you. ?Avoid sitting for a long time without moving. Get up to take short walks every 1-2 hours. This is important to improve blood flow and breathing. Ask for help if you feel weak or unsteady. ?Do exercises as told by your health care provider. ?Rest as told by your health care provider. ?General instructions ?Drink enough fluid to keep your urine pale yellow. ?Wear compression stockings as told by your health care provider. ?Do not use any products that contain nicotine or tobacco. These products include cigarettes, chewing tobacco, and vaping devices, such as e-cigarettes. If you need help quitting, ask your health care provider. ?Keep all follow-up visits. This is important. ?Contact a health care provider if: ?You miss a dose of your blood thinner, if applicable. ?Your symptoms do not improve. ?You have unusual bruising. ?You have nausea, vomiting, or diarrhea that lasts for more than a day. ?Get help right away if: ?You are breathing fast or have chest pain. ?You have blood in your vomit, urine, or stool. ?You have severe pain in your affected arm or leg or new pain in any arm or leg. ?You have light-headedness, dizziness, a severe headache, or confusion. ?These symptoms may represent a serious problem that is an emergency. Do not wait to see if the symptoms will go away. Get medical help right away. Call your local emergency services (911 in the [place] Do not drive yourself to the hospital. ?Summary ?Thrombophlebitis is a condition in which a blood clot forms in a vein, causing inflammation and often pain. This can happen in both superficial and deep veins. ?The main symptom of  this condition is swelling and pain around the affected vein. Tenderness and redness may also be present. ?Treatment may include warm compresses, compression stockings, anti-inflammatory medicines, or blood thinners. ?Make sure you take all medicines, especially blood thinners, as instructed and keep all follow-up visits to ensure proper healing of the vein. ?This information is not intended to replace advice given to you by your health care provider. Make sure you discuss any questions you have with your health care provider. ?Document Revised: 07/31/2020 Document Reviewed: 07/31/2020 ?Elsevier Patient Education ? [place]. ? ?

## 2021-06-11 NOTE — Progress Notes (Signed)
? ?Acute Office Visit ? ?Subjective:  ? ?  ?Patient ID: Alicia White, female    DOB: 25-Nov-1960, 61 y.o.   MRN: [id number] ? ?Chief Complaint  ?Patient presents with  ? Edema  ?  Throat, bilateral calves  ? ? ?HPI ?Patient is in today for swollen uvula and tonsils for 4-5 days. Taking OTC throat drops and sprays. It almost gags her at times. Lymphnodes started swelling in neck. She is also having some swelling of both legs but left leg is medially red and swollen. This has been going on for weeks. No SOB,headache or vision changes.  ? ? ?ROS ? ?See HPI.  ?   ?Objective:  ?  ?BP 138/75   Pulse (!) 103   Temp 98.6 ?F (37 ?C) (Oral)   Ht '5\' 4"'$  (1.626 m)   Wt (!) 321 lb (145.6 kg)   LMP 06/14/2012   SpO2 97%   BMI 55.10 kg/m?  ?BP Readings from Last 3 Encounters:  ?06/11/21 138/75  ?11/08/20 (!) 151/76  ?02/03/20 140/89  ? ?Wt Readings from Last 3 Encounters:  ?06/11/21 (!) 321 lb (145.6 kg)  ?11/08/20 (!) 312 lb (141.5 kg)  ?02/03/20 (!) 313 lb 12.8 oz (142.3 kg)  ? ?  ? ?Physical Exam ?Vitals reviewed.  ?Constitutional:   ?   Appearance: Normal appearance. She is obese.  ?HENT:  ?   Head: Normocephalic.  ?   Right Ear: Tympanic membrane, ear canal and external ear normal. There is no impacted cerumen.  ?   Left Ear: Tympanic membrane, ear canal and external ear normal. There is no impacted cerumen.  ?   Nose: Nose normal.  ?   Mouth/Throat:  ?   Mouth: Mucous membranes are moist.  ?   Pharynx: Posterior oropharyngeal erythema present.  ?   Comments: Bilateral tonsils erythematous and swollen and swollen uvula, no exudate. ?Eyes:  ?   Conjunctiva/sclera: Conjunctivae normal.  ?Cardiovascular:  ?   Rate and Rhythm: Normal rate and regular rhythm.  ?Pulmonary:  ?   Effort: Pulmonary effort is normal.  ?   Breath sounds: Normal breath sounds.  ?Musculoskeletal:  ?   Cervical back: Tenderness present.  ?   Right lower leg: Edema present.  ?   Left lower leg: Edema present.  ?   Comments: Left leg  erythematous/warm/tender vein on medial lower leg.  ?1+ pitting edema bilateral legs  ?Lymphadenopathy:  ?   Cervical: Cervical adenopathy present.  ?Neurological:  ?   General: No focal deficit present.  ?   Mental Status: She is alert and oriented to person, place, and time.  ?Psychiatric:     ?   Mood and Affect: Mood normal.  ? ? ? ? ?   ?Assessment & Plan:  ?..Sanora was seen today for edema. ? ?Diagnoses and all orders for this visit: ? ?Uvular swelling ?-     predniSONE (DELTASONE) 50 MG tablet; Take one tablet for 5 days. ?-     doxycycline (VIBRA-TABS) 100 MG tablet; Take 1 tablet (100 mg total) by mouth 2 (two) times daily. ? ?Thrombophlebitis ?-     predniSONE (DELTASONE) 50 MG tablet; Take one tablet for 5 days. ?-     furosemide (LASIX) 20 MG tablet; Take 1 tablet (20 mg total) by mouth daily. ?-     doxycycline (VIBRA-TABS) 100 MG tablet; Take 1 tablet (100 mg total) by mouth 2 (two) times daily. ? ? ?Treated with doxy and prednisone ?Gargle with salt  water ?Lasix for leg swelling as needed ?Compression and leg elevation ? ?Iran Planas, PA-C ? ? ?

## 2021-06-15 ENCOUNTER — Encounter: Payer: Self-pay | Admitting: Physician Assistant

## 2021-06-19 ENCOUNTER — Other Ambulatory Visit: Payer: Self-pay | Admitting: Physician Assistant

## 2021-06-19 DIAGNOSIS — M170 Bilateral primary osteoarthritis of knee: Secondary | ICD-10-CM

## 2021-06-19 DIAGNOSIS — G8929 Other chronic pain: Secondary | ICD-10-CM

## 2021-09-18 ENCOUNTER — Encounter: Payer: Self-pay | Admitting: Physician Assistant

## 2021-10-12 ENCOUNTER — Telehealth (INDEPENDENT_AMBULATORY_CARE_PROVIDER_SITE_OTHER): Payer: BC Managed Care – PPO | Admitting: Physician Assistant

## 2021-10-12 ENCOUNTER — Encounter: Payer: Self-pay | Admitting: Physician Assistant

## 2021-10-12 VITALS — BP 128/88 | HR 88 | Ht 64.0 in

## 2021-10-12 DIAGNOSIS — I809 Phlebitis and thrombophlebitis of unspecified site: Secondary | ICD-10-CM | POA: Diagnosis not present

## 2021-10-12 DIAGNOSIS — Z13220 Encounter for screening for lipoid disorders: Secondary | ICD-10-CM

## 2021-10-12 DIAGNOSIS — G478 Other sleep disorders: Secondary | ICD-10-CM

## 2021-10-12 DIAGNOSIS — M5441 Lumbago with sciatica, right side: Secondary | ICD-10-CM

## 2021-10-12 DIAGNOSIS — R600 Localized edema: Secondary | ICD-10-CM

## 2021-10-12 DIAGNOSIS — M25512 Pain in left shoulder: Secondary | ICD-10-CM

## 2021-10-12 DIAGNOSIS — G47419 Narcolepsy without cataplexy: Secondary | ICD-10-CM | POA: Diagnosis not present

## 2021-10-12 DIAGNOSIS — G8929 Other chronic pain: Secondary | ICD-10-CM

## 2021-10-12 DIAGNOSIS — Z6841 Body Mass Index (BMI) 40.0 and over, adult: Secondary | ICD-10-CM

## 2021-10-12 DIAGNOSIS — R0683 Snoring: Secondary | ICD-10-CM

## 2021-10-12 DIAGNOSIS — L680 Hirsutism: Secondary | ICD-10-CM

## 2021-10-12 DIAGNOSIS — R5383 Other fatigue: Secondary | ICD-10-CM | POA: Insufficient documentation

## 2021-10-12 MED ORDER — TRAMADOL HCL 50 MG PO TABS: 50.0000 mg | ORAL_TABLET | Freq: Two times a day (BID) | ORAL | 0 refills | Status: DC | PRN

## 2021-10-12 MED ORDER — FUROSEMIDE 20 MG PO TABS: 20.0000 mg | ORAL_TABLET | Freq: Two times a day (BID) | ORAL | 1 refills | Status: DC
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
## 2021-10-12 NOTE — Progress Notes (Unsigned)
Pt reports  a onset downiness  at work ,pt states she has been having issues  stay awake at work and brain  fog  and lapse in memory   swelling in lower legs and feet ,fmla forms to be filled out

## 2021-10-15 ENCOUNTER — Telehealth: Payer: Self-pay | Admitting: Neurology

## 2021-10-15 ENCOUNTER — Encounter: Payer: Self-pay | Admitting: Physician Assistant

## 2021-10-15 NOTE — Telephone Encounter (Signed)
I called patient for payment in order for faxed to be completed, she stated she will call back with payment, I let her know we can then fax paperwork.

## 2021-10-15 NOTE — Telephone Encounter (Signed)
Patient notfiied for pick-up and fee.

## 2021-10-15 NOTE — Telephone Encounter (Signed)
Forms completed, copy sent to scan, copy to hold. Given to front desk to collect payment.

## 2021-10-18 ENCOUNTER — Telehealth: Payer: Self-pay | Admitting: Neurology

## 2021-10-18 NOTE — Telephone Encounter (Signed)
Shane Crutch, benefit manager with [employer], called and lvm with questions on whether patient's forms were for a block of time or intermittent. [phone].

## 2021-10-19 ENCOUNTER — Encounter: Payer: Self-pay | Admitting: Physician Assistant

## 2021-10-23 ENCOUNTER — Encounter: Payer: Self-pay | Admitting: Physician Assistant

## 2021-10-23 NOTE — Telephone Encounter (Signed)
Forms were completed for one block of time. 10/12/2021 - 12/12/2021. Called and let Raquel Sarna know.

## 2021-11-06 ENCOUNTER — Other Ambulatory Visit: Payer: Self-pay | Admitting: Physician Assistant

## 2021-11-06 DIAGNOSIS — M5441 Lumbago with sciatica, right side: Secondary | ICD-10-CM

## 2021-11-06 NOTE — Telephone Encounter (Signed)
Last written 11/08/2020 #90 with 1 refill  Last appt 10/12/2021 Please advise

## 2021-12-12 ENCOUNTER — Encounter: Payer: Self-pay | Admitting: Physician Assistant

## 2021-12-12 DIAGNOSIS — R5383 Other fatigue: Secondary | ICD-10-CM

## 2021-12-12 DIAGNOSIS — G478 Other sleep disorders: Secondary | ICD-10-CM

## 2021-12-12 DIAGNOSIS — R0683 Snoring: Secondary | ICD-10-CM

## 2021-12-17 ENCOUNTER — Other Ambulatory Visit: Payer: Self-pay | Admitting: Physician Assistant

## 2021-12-17 DIAGNOSIS — Z1231 Encounter for screening mammogram for malignant neoplasm of breast: Secondary | ICD-10-CM

## 2021-12-17 NOTE — Addendum Note (Signed)
Addended by: Narda Rutherford on: 12/17/2021 09:18 AM   Modules accepted: Orders

## 2021-12-18 LAB — TSH: TSH: 3.57 mIU/L (ref 0.40–4.50)

## 2021-12-18 LAB — COMPLETE METABOLIC PANEL WITH GFR
AG Ratio: 1.1 (calc) (ref 1.0–2.5)
ALT: 28 U/L (ref 6–29)
AST: 22 U/L (ref 10–35)
Albumin: 3.9 g/dL (ref 3.6–5.1)
Alkaline phosphatase (APISO): 72 U/L (ref 37–153)
BUN: 7 mg/dL (ref 7–25)
CO2: 26 mmol/L (ref 20–32)
Calcium: 9.3 mg/dL (ref 8.6–10.4)
Chloride: 101 mmol/L (ref 98–110)
Creat: 0.74 mg/dL (ref 0.50–1.03)
Globulin: 3.7 g/dL (calc) (ref 1.9–3.7)
Glucose, Bld: 87 mg/dL (ref 65–99)
Potassium: 4.2 mmol/L (ref 3.5–5.3)
Sodium: 137 mmol/L (ref 135–146)
Total Bilirubin: 0.7 mg/dL (ref 0.2–1.2)
Total Protein: 7.6 g/dL (ref 6.1–8.1)
eGFR: 93 mL/min/{1.73_m2} (ref >=60)

## 2021-12-18 LAB — LIPID PANEL W/REFLEX DIRECT LDL
Cholesterol: 174 mg/dL (ref <200)
HDL: 41 mg/dL — ABNORMAL LOW (ref >=50)
LDL Cholesterol (Calc): 114 mg/dL (calc) — ABNORMAL HIGH
Non-HDL Cholesterol (Calc): 133 mg/dL (calc) — ABNORMAL HIGH (ref <130)
Total CHOL/HDL Ratio: 4.2 (calc) (ref <5.0)
Triglycerides: 88 mg/dL (ref <150)

## 2021-12-18 LAB — CBC WITH DIFFERENTIAL/PLATELET
Absolute Monocytes: 694 cells/uL (ref 200–950)
Basophils Absolute: 45 cells/uL (ref 0–200)
Basophils Relative: 0.5 %
Eosinophils Absolute: 249 cells/uL (ref 15–500)
Eosinophils Relative: 2.8 %
HCT: 41.1 % (ref 35.0–45.0)
Hemoglobin: 13.3 g/dL (ref 11.7–15.5)
Lymphs Abs: 1371 cells/uL (ref 850–3900)
MCH: 28.6 pg (ref 27.0–33.0)
MCHC: 32.4 g/dL (ref 32.0–36.0)
MCV: 88.4 fL (ref 80.0–100.0)
MPV: 10.3 fL (ref 7.5–12.5)
Monocytes Relative: 7.8 %
Neutro Abs: 6542 cells/uL (ref 1500–7800)
Neutrophils Relative %: 73.5 %
Platelets: 275 10*3/uL (ref 140–400)
RBC: 4.65 10*6/uL (ref 3.80–5.10)
RDW: 13.4 % (ref 11.0–15.0)
Total Lymphocyte: 15.4 %
WBC: 8.9 10*3/uL (ref 3.8–10.8)

## 2021-12-18 LAB — VITAMIN D 25 HYDROXY (VIT D DEFICIENCY, FRACTURES): Vit D, 25-Hydroxy: 25 ng/mL — ABNORMAL LOW (ref 30–100)

## 2021-12-18 LAB — BRAIN NATRIURETIC PEPTIDE: Brain Natriuretic Peptide: 11 pg/mL (ref <100)

## 2021-12-18 NOTE — Progress Notes (Signed)
Vitamin D low. Need to be on at least 2000 units a day.  BNP normal.  Kidney, liver, glucose look great.   [name].The 10-year ASCVD risk score (Arnett DK, et al., 2019) is: 3.3%   Values used to calculate the score:     Age: 61 years     Sex: Female     Is Non-Hispanic African American: No     Diabetic: No     Tobacco smoker: No     Systolic Blood Pressure: 800 mmHg     Is BP treated: No     HDL Cholesterol: 41 mg/dL     Total Cholesterol: 174 mg/dL

## 2021-12-20 ENCOUNTER — Ambulatory Visit (INDEPENDENT_AMBULATORY_CARE_PROVIDER_SITE_OTHER): Payer: BC Managed Care – PPO

## 2021-12-20 DIAGNOSIS — Z1231 Encounter for screening mammogram for malignant neoplasm of breast: Secondary | ICD-10-CM

## 2021-12-24 ENCOUNTER — Telehealth: Payer: Self-pay

## 2021-12-24 NOTE — Progress Notes (Signed)
Normal mammogram. Follow up in 1 year.

## 2021-12-25 NOTE — Telephone Encounter (Signed)
Note to sign encounter only.

## 2022-01-05 ENCOUNTER — Other Ambulatory Visit: Payer: Self-pay | Admitting: Physician Assistant

## 2022-01-05 DIAGNOSIS — G8929 Other chronic pain: Secondary | ICD-10-CM

## 2022-01-05 DIAGNOSIS — M170 Bilateral primary osteoarthritis of knee: Secondary | ICD-10-CM

## 2022-01-28 ENCOUNTER — Other Ambulatory Visit: Payer: Self-pay | Admitting: Physician Assistant

## 2022-01-28 DIAGNOSIS — I878 Other specified disorders of veins: Secondary | ICD-10-CM

## 2022-01-28 DIAGNOSIS — L680 Hirsutism: Secondary | ICD-10-CM

## 2022-01-28 DIAGNOSIS — G8929 Other chronic pain: Secondary | ICD-10-CM

## 2022-01-28 DIAGNOSIS — M5441 Lumbago with sciatica, right side: Secondary | ICD-10-CM

## 2022-01-28 NOTE — Telephone Encounter (Signed)
Patient seen in August, told to follow up in 4 weeks  Gabapentin last written in our office on 11/08/2020 for a 6 month supply  Spironolactone last written in our office 11/08/2020 for a 1 year supply  Tramadol last written 10/12/2021 #60 no refills

## 2022-04-18 ENCOUNTER — Other Ambulatory Visit: Payer: Self-pay | Admitting: Physician Assistant

## 2022-04-18 DIAGNOSIS — R600 Localized edema: Secondary | ICD-10-CM

## 2022-09-01 ENCOUNTER — Other Ambulatory Visit: Payer: Self-pay | Admitting: Physician Assistant

## 2022-09-01 DIAGNOSIS — I878 Other specified disorders of veins: Secondary | ICD-10-CM

## 2022-09-01 DIAGNOSIS — L680 Hirsutism: Secondary | ICD-10-CM

## 2022-12-13 ENCOUNTER — Other Ambulatory Visit: Payer: Self-pay | Admitting: Physician Assistant

## 2022-12-13 DIAGNOSIS — R5383 Other fatigue: Secondary | ICD-10-CM

## 2022-12-13 DIAGNOSIS — G478 Other sleep disorders: Secondary | ICD-10-CM

## 2022-12-13 DIAGNOSIS — R0683 Snoring: Secondary | ICD-10-CM

## 2022-12-17 ENCOUNTER — Other Ambulatory Visit: Payer: Self-pay | Admitting: Physician Assistant

## 2022-12-17 DIAGNOSIS — M5441 Lumbago with sciatica, right side: Secondary | ICD-10-CM

## 2022-12-17 DIAGNOSIS — R600 Localized edema: Secondary | ICD-10-CM

## 2022-12-17 NOTE — Telephone Encounter (Signed)
Patient called she asking if her prescriptions be expedited  please Flexeril 10mg  Lasix  20mg    Please submit to [facility] Phone [phone]

## 2023-04-01 ENCOUNTER — Other Ambulatory Visit: Payer: Self-pay | Admitting: Physician Assistant

## 2023-04-01 DIAGNOSIS — M170 Bilateral primary osteoarthritis of knee: Secondary | ICD-10-CM

## 2023-04-01 DIAGNOSIS — R600 Localized edema: Secondary | ICD-10-CM

## 2023-04-01 DIAGNOSIS — M5441 Lumbago with sciatica, right side: Secondary | ICD-10-CM

## 2023-04-01 DIAGNOSIS — G8929 Other chronic pain: Secondary | ICD-10-CM

## 2023-05-09 ENCOUNTER — Other Ambulatory Visit: Payer: Self-pay | Admitting: Physician Assistant

## 2023-05-09 DIAGNOSIS — I878 Other specified disorders of veins: Secondary | ICD-10-CM

## 2023-05-09 DIAGNOSIS — M5441 Lumbago with sciatica, right side: Secondary | ICD-10-CM

## 2023-05-09 DIAGNOSIS — G8929 Other chronic pain: Secondary | ICD-10-CM

## 2023-05-09 DIAGNOSIS — L680 Hirsutism: Secondary | ICD-10-CM

## 2023-08-28 ENCOUNTER — Other Ambulatory Visit: Payer: Self-pay | Admitting: Physician Assistant

## 2023-08-28 DIAGNOSIS — I878 Other specified disorders of veins: Secondary | ICD-10-CM

## 2023-08-28 DIAGNOSIS — M5441 Lumbago with sciatica, right side: Secondary | ICD-10-CM

## 2023-08-28 DIAGNOSIS — L680 Hirsutism: Secondary | ICD-10-CM

## 2023-08-28 DIAGNOSIS — G8929 Other chronic pain: Secondary | ICD-10-CM

## 2023-08-29 ENCOUNTER — Other Ambulatory Visit: Payer: Self-pay | Admitting: Physician Assistant

## 2023-08-29 DIAGNOSIS — G8929 Other chronic pain: Secondary | ICD-10-CM

## 2023-08-29 DIAGNOSIS — M5441 Lumbago with sciatica, right side: Secondary | ICD-10-CM

## 2023-08-29 DIAGNOSIS — L680 Hirsutism: Secondary | ICD-10-CM

## 2023-08-29 NOTE — Telephone Encounter (Signed)
 Copied from CRM [id number]. Topic: Clinical - Medication Refill >> Aug 29, 2023 12:39 PM Zane F wrote: Patient is in a [facility] for about 6 months and patient is unable to come to the office to have an in person visit due to this. The patient is requesting that two of her prescriptions listed below be refilled until the patient leaves the recovery center. The patient has been going without the two prescriptions for 2-3 months.  [facility] [address], [place] [address]  Medication:   gabapentin  (NEURONTIN ) 100 MG capsule  spironolactone  (ALDACTONE ) 25 MG tablet  Has the patient contacted their pharmacy? Yes   This is the patient's preferred pharmacy:   Ph[facility] at [id number] [address] [place], [place], [place] [address] [phone]  Is this the correct pharmacy for this prescription? Yes   Has the prescription been filled recently? No  Is the patient out of the medication? Yes  Has the patient been seen for an appointment in the last year OR does the patient have an upcoming appointment? No  Can we respond through MyChart? No, not allowed to have her cell phone  Agent: Please be advised that Rx refills may take up to 3 business days. We ask that you follow-up with your pharmacy.

## 2023-10-21 ENCOUNTER — Encounter: Payer: Self-pay | Admitting: Sports Medicine
